# Patient Record
Sex: Female | Born: 1935
Health system: Southern US, Community
[De-identification: ages and names within clinical notes are randomized; demographics above are authoritative.]

## PROBLEM LIST (undated history)

## (undated) DIAGNOSIS — J45909 Unspecified asthma, uncomplicated: Secondary | ICD-10-CM

## (undated) DIAGNOSIS — R1032 Left lower quadrant pain: Secondary | ICD-10-CM

## (undated) DIAGNOSIS — I10 Essential (primary) hypertension: Secondary | ICD-10-CM

## (undated) DIAGNOSIS — D649 Anemia, unspecified: Secondary | ICD-10-CM

## (undated) DIAGNOSIS — R110 Nausea: Secondary | ICD-10-CM

## (undated) DIAGNOSIS — N200 Calculus of kidney: Secondary | ICD-10-CM

## (undated) DIAGNOSIS — K5730 Diverticulosis of large intestine without perforation or abscess without bleeding: Secondary | ICD-10-CM

## (undated) DIAGNOSIS — K5090 Crohn's disease, unspecified, without complications: Secondary | ICD-10-CM

## (undated) HISTORY — DX: Nausea: R11.0

## (undated) HISTORY — DX: Essential (primary) hypertension: I10

## (undated) HISTORY — DX: Crohn's disease, unspecified, without complications: K50.90

## (undated) HISTORY — DX: Anemia, unspecified: D64.9

## (undated) HISTORY — PX: CATARACT EXTRACTION: SUR2

## (undated) HISTORY — PX: VAGINAL HYSTERECTOMY: SHX2639

## (undated) HISTORY — DX: Diverticulosis of large intestine without perforation or abscess without bleeding: K57.30

## (undated) HISTORY — DX: Unspecified asthma, uncomplicated: J45.909

## (undated) HISTORY — DX: Calculus of kidney: N20.0

## (undated) HISTORY — DX: Left lower quadrant pain: R10.32

---

## 1998-02-11 ENCOUNTER — Ambulatory Visit (HOSPITAL_COMMUNITY): Admission: RE | Admit: 1998-02-11 | Discharge: 1998-02-11 | Payer: Self-pay | Admitting: Internal Medicine

## 1999-11-21 ENCOUNTER — Ambulatory Visit (HOSPITAL_COMMUNITY): Admission: RE | Admit: 1999-11-21 | Discharge: 1999-11-21 | Payer: Self-pay | Admitting: Family Medicine

## 1999-11-21 ENCOUNTER — Encounter: Payer: Self-pay | Admitting: Family Medicine

## 1999-12-01 ENCOUNTER — Encounter: Admission: RE | Admit: 1999-12-01 | Discharge: 1999-12-14 | Payer: Self-pay

## 2000-04-16 ENCOUNTER — Emergency Department (HOSPITAL_COMMUNITY): Admission: EM | Admit: 2000-04-16 | Discharge: 2000-04-16 | Payer: Self-pay | Admitting: Emergency Medicine

## 2001-07-21 ENCOUNTER — Emergency Department (HOSPITAL_COMMUNITY): Admission: EM | Admit: 2001-07-21 | Discharge: 2001-07-21 | Payer: Self-pay | Admitting: Emergency Medicine

## 2002-01-14 ENCOUNTER — Emergency Department (HOSPITAL_COMMUNITY): Admission: EM | Admit: 2002-01-14 | Discharge: 2002-01-15 | Payer: Self-pay | Admitting: Emergency Medicine

## 2003-06-04 ENCOUNTER — Emergency Department (HOSPITAL_COMMUNITY): Admission: EM | Admit: 2003-06-04 | Discharge: 2003-06-04 | Payer: Self-pay | Admitting: Emergency Medicine

## 2004-04-08 ENCOUNTER — Emergency Department (HOSPITAL_COMMUNITY): Admission: EM | Admit: 2004-04-08 | Discharge: 2004-04-08 | Payer: Self-pay | Admitting: Family Medicine

## 2004-05-05 ENCOUNTER — Emergency Department (HOSPITAL_COMMUNITY): Admission: EM | Admit: 2004-05-05 | Discharge: 2004-05-05 | Payer: Self-pay | Admitting: Emergency Medicine

## 2004-06-20 ENCOUNTER — Ambulatory Visit: Payer: Self-pay | Admitting: *Deleted

## 2004-07-18 ENCOUNTER — Ambulatory Visit: Payer: Self-pay | Admitting: Family Medicine

## 2005-01-13 ENCOUNTER — Emergency Department (HOSPITAL_COMMUNITY): Admission: EM | Admit: 2005-01-13 | Discharge: 2005-01-13 | Payer: Self-pay | Admitting: Orthopedic Surgery

## 2006-01-17 ENCOUNTER — Emergency Department (HOSPITAL_COMMUNITY): Admission: EM | Admit: 2006-01-17 | Discharge: 2006-01-17 | Payer: Self-pay | Admitting: Emergency Medicine

## 2006-02-05 ENCOUNTER — Encounter: Admission: RE | Admit: 2006-02-05 | Discharge: 2006-05-06 | Payer: Self-pay | Admitting: Orthopedic Surgery

## 2006-04-04 ENCOUNTER — Ambulatory Visit: Payer: Self-pay | Admitting: Psychology

## 2006-11-14 ENCOUNTER — Emergency Department (HOSPITAL_COMMUNITY): Admission: EM | Admit: 2006-11-14 | Discharge: 2006-11-14 | Payer: Self-pay | Admitting: Emergency Medicine

## 2007-12-04 ENCOUNTER — Ambulatory Visit: Payer: Self-pay | Admitting: Internal Medicine

## 2007-12-04 LAB — CONVERTED CEMR LAB
ALT: 14 units/L (ref 0–35)
Alkaline Phosphatase: 97 units/L (ref 39–117)
Basophils Absolute: 0.3 10*3/uL — ABNORMAL HIGH (ref 0.0–0.1)
Creatinine, Ser: 0.5 mg/dL (ref 0.4–1.2)
Eosinophils Absolute: 0.2 10*3/uL (ref 0.0–0.6)
Eosinophils Relative: 3.7 % (ref 0.0–5.0)
HCT: 27 % — ABNORMAL LOW (ref 36.0–46.0)
Lymphocytes Relative: 23.5 % (ref 12.0–46.0)
MCHC: 29.3 g/dL — ABNORMAL LOW (ref 30.0–36.0)
MCV: 61.8 fL — ABNORMAL LOW (ref 78.0–100.0)
Neutrophils Relative %: 63.7 % (ref 43.0–77.0)
Platelets: 404 10*3/uL — ABNORMAL HIGH (ref 150–400)
Potassium: 4.5 meq/L (ref 3.5–5.1)
RBC: 4.37 M/uL (ref 3.87–5.11)
RDW: 18.1 % — ABNORMAL HIGH (ref 11.5–14.6)
Saturation Ratios: 5.9 % — ABNORMAL LOW (ref 20.0–50.0)
WBC: 6.7 10*3/uL (ref 4.5–10.5)

## 2007-12-05 ENCOUNTER — Ambulatory Visit (HOSPITAL_COMMUNITY): Admission: RE | Admit: 2007-12-05 | Discharge: 2007-12-05 | Payer: Self-pay | Admitting: Internal Medicine

## 2007-12-08 ENCOUNTER — Encounter: Payer: Self-pay | Admitting: Internal Medicine

## 2007-12-08 ENCOUNTER — Ambulatory Visit (HOSPITAL_COMMUNITY): Admission: RE | Admit: 2007-12-08 | Discharge: 2007-12-08 | Payer: Self-pay | Admitting: Internal Medicine

## 2007-12-11 ENCOUNTER — Ambulatory Visit: Payer: Self-pay | Admitting: Internal Medicine

## 2007-12-26 ENCOUNTER — Ambulatory Visit: Payer: Self-pay | Admitting: Internal Medicine

## 2007-12-26 LAB — CONVERTED CEMR LAB
Basophils Absolute: 0.1 10*3/uL (ref 0.0–0.1)
Basophils Relative: 0.9 % (ref 0.0–1.0)
Eosinophils Absolute: 0.2 10*3/uL (ref 0.0–0.7)
Eosinophils Relative: 3.8 % (ref 0.0–5.0)
HCT: 37.5 % (ref 36.0–46.0)
Hemoglobin: 11.9 g/dL — ABNORMAL LOW (ref 12.0–15.0)
Lymphocytes Relative: 24.5 % (ref 12.0–46.0)
MCHC: 31.8 g/dL (ref 30.0–36.0)
MCV: 73 fL — ABNORMAL LOW (ref 78.0–100.0)
Neutro Abs: 3.8 10*3/uL (ref 1.4–7.7)
RBC: 5.13 M/uL — ABNORMAL HIGH (ref 3.87–5.11)
RDW: 30 % — ABNORMAL HIGH (ref 11.5–14.6)

## 2008-01-13 ENCOUNTER — Telehealth: Payer: Self-pay | Admitting: Internal Medicine

## 2008-01-15 ENCOUNTER — Telehealth: Payer: Self-pay | Admitting: Internal Medicine

## 2008-01-15 ENCOUNTER — Ambulatory Visit: Payer: Self-pay | Admitting: Internal Medicine

## 2008-01-23 ENCOUNTER — Ambulatory Visit: Payer: Self-pay | Admitting: Internal Medicine

## 2008-01-23 ENCOUNTER — Encounter: Payer: Self-pay | Admitting: Internal Medicine

## 2008-01-29 ENCOUNTER — Encounter: Payer: Self-pay | Admitting: Internal Medicine

## 2008-01-30 ENCOUNTER — Encounter: Payer: Self-pay | Admitting: Internal Medicine

## 2008-02-16 DIAGNOSIS — J209 Acute bronchitis, unspecified: Secondary | ICD-10-CM | POA: Insufficient documentation

## 2008-02-16 DIAGNOSIS — R110 Nausea: Secondary | ICD-10-CM | POA: Insufficient documentation

## 2008-02-16 DIAGNOSIS — K5730 Diverticulosis of large intestine without perforation or abscess without bleeding: Secondary | ICD-10-CM | POA: Insufficient documentation

## 2008-02-16 DIAGNOSIS — D649 Anemia, unspecified: Secondary | ICD-10-CM | POA: Insufficient documentation

## 2008-02-16 DIAGNOSIS — R1032 Left lower quadrant pain: Secondary | ICD-10-CM | POA: Insufficient documentation

## 2008-02-16 DIAGNOSIS — Z87442 Personal history of urinary calculi: Secondary | ICD-10-CM | POA: Insufficient documentation

## 2008-02-16 DIAGNOSIS — I10 Essential (primary) hypertension: Secondary | ICD-10-CM | POA: Insufficient documentation

## 2008-02-18 ENCOUNTER — Ambulatory Visit: Payer: Self-pay | Admitting: Internal Medicine

## 2008-02-18 LAB — CONVERTED CEMR LAB
Basophils Absolute: 0 10*3/uL (ref 0.0–0.1)
Basophils Relative: 0 % (ref 0.0–1.0)
Eosinophils Relative: 0.2 % (ref 0.0–5.0)
HCT: 43.4 % (ref 36.0–46.0)
Hemoglobin: 14.6 g/dL (ref 12.0–15.0)
MCHC: 33.7 g/dL (ref 30.0–36.0)
Monocytes Relative: 4.7 % (ref 3.0–12.0)
RBC: 5 M/uL (ref 3.87–5.11)

## 2008-04-08 ENCOUNTER — Telehealth: Payer: Self-pay | Admitting: Internal Medicine

## 2008-04-28 ENCOUNTER — Ambulatory Visit: Payer: Self-pay | Admitting: Internal Medicine

## 2008-04-28 LAB — CONVERTED CEMR LAB
Basophils Absolute: 0 10*3/uL (ref 0.0–0.1)
Basophils Relative: 0.1 % (ref 0.0–3.0)
MCHC: 35.7 g/dL (ref 30.0–36.0)
MCV: 96.1 fL (ref 78.0–100.0)
Monocytes Absolute: 0.4 10*3/uL (ref 0.1–1.0)
Neutrophils Relative %: 79.5 % — ABNORMAL HIGH (ref 43.0–77.0)
Saturation Ratios: 43.9 % (ref 20.0–50.0)

## 2008-05-06 ENCOUNTER — Telehealth: Payer: Self-pay | Admitting: Internal Medicine

## 2008-05-11 ENCOUNTER — Ambulatory Visit (HOSPITAL_COMMUNITY): Admission: RE | Admit: 2008-05-11 | Discharge: 2008-05-11 | Payer: Self-pay | Admitting: Internal Medicine

## 2008-06-10 ENCOUNTER — Ambulatory Visit: Payer: Self-pay | Admitting: Internal Medicine

## 2008-06-10 LAB — CONVERTED CEMR LAB
Basophils Absolute: 0.1 10*3/uL (ref 0.0–0.1)
Eosinophils Absolute: 0.2 10*3/uL (ref 0.0–0.7)
Eosinophils Relative: 2.9 % (ref 0.0–5.0)
HCT: 39.8 % (ref 36.0–46.0)
Hemoglobin: 13.9 g/dL (ref 12.0–15.0)
Lymphocytes Relative: 19.1 % (ref 12.0–46.0)
MCV: 93.4 fL (ref 78.0–100.0)
Neutro Abs: 5.7 10*3/uL (ref 1.4–7.7)
RDW: 11.6 % (ref 11.5–14.6)
WBC: 7.9 10*3/uL (ref 4.5–10.5)

## 2008-07-05 ENCOUNTER — Telehealth: Payer: Self-pay | Admitting: Internal Medicine

## 2008-08-23 ENCOUNTER — Ambulatory Visit: Payer: Self-pay | Admitting: Internal Medicine

## 2008-08-24 ENCOUNTER — Telehealth: Payer: Self-pay | Admitting: Internal Medicine

## 2008-10-14 ENCOUNTER — Telehealth: Payer: Self-pay | Admitting: Internal Medicine

## 2009-01-14 ENCOUNTER — Encounter: Admission: RE | Admit: 2009-01-14 | Discharge: 2009-01-14 | Payer: Self-pay | Admitting: Internal Medicine

## 2009-08-01 ENCOUNTER — Ambulatory Visit: Payer: Self-pay | Admitting: Internal Medicine

## 2009-08-01 ENCOUNTER — Telehealth: Payer: Self-pay | Admitting: Internal Medicine

## 2009-08-02 ENCOUNTER — Ambulatory Visit (HOSPITAL_COMMUNITY): Admission: RE | Admit: 2009-08-02 | Discharge: 2009-08-02 | Payer: Self-pay | Admitting: Internal Medicine

## 2009-08-02 LAB — CONVERTED CEMR LAB
Basophils Absolute: 0.1 10*3/uL (ref 0.0–0.1)
Basophils Relative: 0.9 % (ref 0.0–3.0)
Eosinophils Absolute: 0.2 10*3/uL (ref 0.0–0.7)
Hemoglobin: 7.7 g/dL — CL (ref 12.0–15.0)
Lymphs Abs: 1.6 10*3/uL (ref 0.7–4.0)
MCHC: 29.9 g/dL — ABNORMAL LOW (ref 30.0–36.0)
MCV: 62.5 fL — ABNORMAL LOW (ref 78.0–100.0)
Monocytes Relative: 10.3 % (ref 3.0–12.0)
Neutro Abs: 3.5 10*3/uL (ref 1.4–7.7)
Neutrophils Relative %: 60.2 % (ref 43.0–77.0)
RBC: 4.11 M/uL (ref 3.87–5.11)
RDW: 17.8 % — ABNORMAL HIGH (ref 11.5–14.6)
WBC: 6 10*3/uL (ref 4.5–10.5)

## 2009-08-09 ENCOUNTER — Ambulatory Visit: Payer: Self-pay | Admitting: Internal Medicine

## 2009-08-10 LAB — CONVERTED CEMR LAB
ALT: 18 units/L (ref 0–35)
CO2: 30 meq/L (ref 19–32)
Calcium: 9.3 mg/dL (ref 8.4–10.5)
Chloride: 101 meq/L (ref 96–112)
Creatinine, Ser: 0.5 mg/dL (ref 0.4–1.2)
Eosinophils Absolute: 0.2 10*3/uL (ref 0.0–0.7)
Eosinophils Relative: 3.2 % (ref 0.0–5.0)
GFR calc non Af Amer: 128.76 mL/min (ref >60)
Iron: 22 ug/dL — ABNORMAL LOW (ref 42–145)
MCV: 67 fL — ABNORMAL LOW (ref 78.0–100.0)
Neutrophils Relative %: 60.3 % (ref 43.0–77.0)
Saturation Ratios: 4.6 % — ABNORMAL LOW (ref 20.0–50.0)
Sodium: 141 meq/L (ref 135–145)
WBC: 5.2 10*3/uL (ref 4.5–10.5)

## 2009-08-23 ENCOUNTER — Ambulatory Visit: Payer: Self-pay | Admitting: Internal Medicine

## 2009-08-23 LAB — CONVERTED CEMR LAB
Basophils Relative: 0.5 % (ref 0.0–3.0)
Eosinophils Absolute: 0.2 10*3/uL (ref 0.0–0.7)
Eosinophils Relative: 2.9 % (ref 0.0–5.0)
HCT: 33.2 % — ABNORMAL LOW (ref 36.0–46.0)
MCHC: 31.1 g/dL (ref 30.0–36.0)
MCV: 68.9 fL — ABNORMAL LOW (ref 78.0–100.0)
Monocytes Relative: 8.7 % (ref 3.0–12.0)
Neutro Abs: 4.5 10*3/uL (ref 1.4–7.7)
Platelets: 327 10*3/uL (ref 150.0–400.0)
RBC: 4.83 M/uL (ref 3.87–5.11)
RDW: 25.3 % — ABNORMAL HIGH (ref 11.5–14.6)
WBC: 6.9 10*3/uL (ref 4.5–10.5)

## 2009-09-23 ENCOUNTER — Ambulatory Visit: Payer: Self-pay | Admitting: Internal Medicine

## 2009-09-27 ENCOUNTER — Telehealth: Payer: Self-pay | Admitting: Internal Medicine

## 2009-09-27 LAB — CONVERTED CEMR LAB
Basophils Relative: 0.9 % (ref 0.0–3.0)
Lymphs Abs: 1.4 10*3/uL (ref 0.7–4.0)
MCHC: 31.2 g/dL (ref 30.0–36.0)
MCV: 73.8 fL — ABNORMAL LOW (ref 78.0–100.0)
Monocytes Absolute: 0.3 10*3/uL (ref 0.1–1.0)
Monocytes Relative: 6.2 % (ref 3.0–12.0)
Platelets: 301 10*3/uL (ref 150.0–400.0)
RBC: 4.62 M/uL (ref 3.87–5.11)
WBC: 5.5 10*3/uL (ref 4.5–10.5)

## 2009-10-04 ENCOUNTER — Telehealth: Payer: Self-pay | Admitting: Internal Medicine

## 2009-10-06 ENCOUNTER — Ambulatory Visit: Payer: Self-pay | Admitting: Internal Medicine

## 2009-10-07 LAB — CONVERTED CEMR LAB
Basophils Relative: 0.5 % (ref 0.0–3.0)
Eosinophils Absolute: 0.2 10*3/uL (ref 0.0–0.7)
HCT: 35 % — ABNORMAL LOW (ref 36.0–46.0)
Lymphs Abs: 1.6 10*3/uL (ref 0.7–4.0)
MCV: 75.6 fL — ABNORMAL LOW (ref 78.0–100.0)
Monocytes Absolute: 0.5 10*3/uL (ref 0.1–1.0)
Monocytes Relative: 7.8 % (ref 3.0–12.0)
Neutro Abs: 4.5 10*3/uL (ref 1.4–7.7)
Platelets: 312 10*3/uL (ref 150.0–400.0)
RBC: 4.63 M/uL (ref 3.87–5.11)
RDW: 23.3 % — ABNORMAL HIGH (ref 11.5–14.6)

## 2009-10-11 ENCOUNTER — Encounter (HOSPITAL_COMMUNITY): Admission: RE | Admit: 2009-10-11 | Discharge: 2010-01-09 | Payer: Self-pay | Admitting: Cardiology

## 2009-10-26 ENCOUNTER — Encounter (INDEPENDENT_AMBULATORY_CARE_PROVIDER_SITE_OTHER): Payer: Self-pay | Admitting: *Deleted

## 2009-10-28 ENCOUNTER — Ambulatory Visit: Payer: Self-pay | Admitting: Internal Medicine

## 2009-10-31 LAB — CONVERTED CEMR LAB
Basophils Relative: 1.2 % (ref 0.0–3.0)
Eosinophils Absolute: 0.2 10*3/uL (ref 0.0–0.7)
Hemoglobin: 12.1 g/dL (ref 12.0–15.0)
Iron: 56 ug/dL (ref 42–145)
Lymphocytes Relative: 26.7 % (ref 12.0–46.0)
Lymphs Abs: 1.5 10*3/uL (ref 0.7–4.0)
MCHC: 32.8 g/dL (ref 30.0–36.0)
Monocytes Absolute: 0.5 10*3/uL (ref 0.1–1.0)
Neutro Abs: 3.4 10*3/uL (ref 1.4–7.7)
Neutrophils Relative %: 59.9 % (ref 43.0–77.0)
Platelets: 241 10*3/uL (ref 150.0–400.0)
Saturation Ratios: 18.5 % — ABNORMAL LOW (ref 20.0–50.0)
Transferrin: 216.7 mg/dL (ref 212.0–360.0)
WBC: 5.7 10*3/uL (ref 4.5–10.5)

## 2009-12-26 ENCOUNTER — Ambulatory Visit: Payer: Self-pay | Admitting: Internal Medicine

## 2009-12-27 LAB — CONVERTED CEMR LAB
Basophils Absolute: 0 10*3/uL (ref 0.0–0.1)
Eosinophils Relative: 3.2 % (ref 0.0–5.0)
HCT: 39.5 % (ref 36.0–46.0)
Hemoglobin: 13.5 g/dL (ref 12.0–15.0)
MCHC: 34.2 g/dL (ref 30.0–36.0)
Monocytes Absolute: 0.5 10*3/uL (ref 0.1–1.0)
Neutro Abs: 4.7 10*3/uL (ref 1.4–7.7)

## 2010-01-02 ENCOUNTER — Ambulatory Visit: Payer: Self-pay | Admitting: Internal Medicine

## 2010-01-02 DIAGNOSIS — K5000 Crohn's disease of small intestine without complications: Secondary | ICD-10-CM | POA: Insufficient documentation

## 2010-02-13 ENCOUNTER — Ambulatory Visit: Payer: Self-pay | Admitting: Internal Medicine

## 2010-02-13 LAB — CONVERTED CEMR LAB
Basophils Absolute: 0 10*3/uL (ref 0.0–0.1)
Basophils Relative: 0.7 % (ref 0.0–3.0)
Eosinophils Relative: 4.9 % (ref 0.0–5.0)
Lymphs Abs: 1.1 10*3/uL (ref 0.7–4.0)
Monocytes Absolute: 0.3 10*3/uL (ref 0.1–1.0)
Monocytes Relative: 5.9 % (ref 3.0–12.0)
Neutrophils Relative %: 67.5 % (ref 43.0–77.0)
Platelets: 320 10*3/uL (ref 150.0–400.0)
WBC: 5.5 10*3/uL (ref 4.5–10.5)

## 2010-05-16 ENCOUNTER — Ambulatory Visit: Payer: Self-pay | Admitting: Internal Medicine

## 2010-05-16 LAB — CONVERTED CEMR LAB
Basophils Relative: 0.7 % (ref 0.0–3.0)
Eosinophils Absolute: 0.2 10*3/uL (ref 0.0–0.7)
Hemoglobin: 11.3 g/dL — ABNORMAL LOW (ref 12.0–15.0)
MCHC: 33.5 g/dL (ref 30.0–36.0)
Monocytes Absolute: 0.6 10*3/uL (ref 0.1–1.0)
Monocytes Relative: 8.9 % (ref 3.0–12.0)
Neutro Abs: 3.9 10*3/uL (ref 1.4–7.7)
Neutrophils Relative %: 60.7 % (ref 43.0–77.0)
Platelets: 309 10*3/uL (ref 150.0–400.0)
RBC: 4.03 M/uL (ref 3.87–5.11)
Saturation Ratios: 10.9 % — ABNORMAL LOW (ref 20.0–50.0)
WBC: 6.5 10*3/uL (ref 4.5–10.5)

## 2010-05-19 ENCOUNTER — Encounter (HOSPITAL_COMMUNITY): Admission: RE | Admit: 2010-05-19 | Discharge: 2010-07-11 | Payer: Self-pay | Admitting: Internal Medicine

## 2010-06-26 ENCOUNTER — Telehealth: Payer: Self-pay | Admitting: Internal Medicine

## 2010-08-16 ENCOUNTER — Ambulatory Visit: Payer: Self-pay | Admitting: Internal Medicine

## 2010-08-17 LAB — CONVERTED CEMR LAB
Basophils Relative: 0.6 % (ref 0.0–3.0)
Iron: 68 ug/dL (ref 42–145)
MCHC: 34.1 g/dL (ref 30.0–36.0)
Monocytes Absolute: 0.6 10*3/uL (ref 0.1–1.0)
Neutrophils Relative %: 63.7 % (ref 43.0–77.0)
RBC: 4.62 M/uL (ref 3.87–5.11)
RDW: 16.9 % — ABNORMAL HIGH (ref 11.5–14.6)
Saturation Ratios: 17.5 % — ABNORMAL LOW (ref 20.0–50.0)
Transferrin: 277.5 mg/dL (ref 212.0–360.0)

## 2010-10-12 NOTE — Progress Notes (Signed)
Summary: Pt. Rescheduled Iron Infusion  Phone Note Call from Patient Call back at Home Phone [phone]   Caller: Patient Call For: Dr. Juanda Chance Reason for Call: Talk to Nurse Summary of Call: pt says she was supposed to get an iron infusion today at [facility], but she is sick this morning and thus would like to resch Initial call taken by: Vallarie Mare,  October 04, 2009 8:37 AM  Follow-up for Phone Call        Iron infusion is rescheduled for 10-11-09 at 9am. Per [facility] Short Stay, pt. needs to have her labs redone, she will get them done this week, the order is in [facility]. Pt. instructed to call back as needed.  Follow-up by: Laureen Ochs LPN,  October 04, 2009 9:26 AM

## 2010-10-12 NOTE — Progress Notes (Signed)
Summary: Triage  Phone Note Call from Patient Call back at Home Phone [phone]   Caller: Patient Call For: Dr. Juanda Chance Reason for Call: Talk to Nurse Summary of Call: having cateract surgery wants to know if she needs bloodwork Initial call taken by: Karna Christmas,  June 26, 2010 12:51 PM  Follow-up for Phone Call        Patient  needs to ask her surgeon if he wants lab work.  No new lab orders from [facility]. Follow-up by: Darcey Nora RN, CGRN,  June 26, 2010 1:40 PM

## 2010-10-12 NOTE — Assessment & Plan Note (Signed)
Summary: f/u--ch.   History of Present Illness Visit Type: Follow-up Visit Primary GI MD: [name] MD Primary Provider: n/a Requesting Provider: n/a Chief Complaint: Routine f/u, ? crohn's.  Pt states that she is better and denies any GI complaints  History of Present Illness:   This is a 76 year old white female with Crohn's disease of the small bowel which was seen on a small bowel capsule endoscopy in June 2009. Patient had multiple ulcerations in the ileum. She responded to sulfasalazine 1 g twice a day. Her anemia was corrected with iron. She then presented with rectal bleeding and a hemoglobin of 7.7 with an MCV of 62. She was transfused 2 units of packed red blood cells at that time and her hemoglobin then returned to normal. Her upper endoscopy and colonoscopies in March 2009 showed severe diverticulosis of the left colon but no evidence of IBD. Her IBD markers are negative. She is intolerant to 6-MP. After a slow decrease in patient's hemoglobin and iron saturation, she was given another iron infusion. On 12/26/09 patient's hemoglobin was at 13.5 and her hematocrit was 39.5. Her iron saturation was again normal at 21.8% up from her previous 18.5% in February 2011. He most recent iron level was 70 and her transferritin was 228.9. Patient comes today for a routine follow up. She currently denies any gastroentestinal complaints.   GI Review of Systems      Denies abdominal pain, acid reflux, belching, bloating, chest pain, dysphagia with liquids, dysphagia with solids, heartburn, loss of appetite, nausea, vomiting, vomiting blood, weight loss, and  weight gain.        Denies anal fissure, black tarry stools, change in bowel habit, constipation, diarrhea, diverticulosis, fecal incontinence, heme positive stool, hemorrhoids, irritable bowel syndrome, jaundice, light color stool, liver problems, rectal bleeding, and  rectal pain.    Current Medications (verified): 1)  Bentyl 10 Mg Caps  (Dicyclomine Hcl) .... Take 1 Capsule By Mouth Twice A Day 2)  Aspirin 81 Mg  Tbec (Aspirin) .... Take 1 Tablet By Mouth Once A Day 3)  Lisinopril 10 Mg  Tabs (Lisinopril) .... Take 1 Tablet By Mouth Once A Day 4)  Proventil Hfa 108 (90 Base) Mcg/act  Aers (Albuterol Sulfate) .... Two Puff Two Times A Day 5)  Iron 325 (65 Fe) Mg  Tabs (Ferrous Sulfate) .Marland Kitchen.. 1 Tablet  By Mouth Once Daily 6)  Tylenol Extra Strength 500 Mg  Tabs (Acetaminophen) .... One Once Daily As Needed 7)  Folic Acid 1 Mg Tabs (Folic Acid) .... Take 1 Tablet By Mouth Once A Day 8)  Vusion 0.25-15-81.35 % Oint (Miconazole-Zinc Oxide-Petrolat) .... Apply 1 Once Daily 9)  Sulfasalazine 500 Mg Tabs (Sulfasalazine) .... Take 2 Tablets By Mouth Two Times A Day . Must Keep Office Visit For Further Refills! 10)  Furosemide 20 Mg Tabs (Furosemide) .Marland Kitchen.. 1 Tablet By Mouth Once Daily  Allergies (verified): No Known Drug Allergies  Past History:  Past Medical History: Reviewed history from 02/16/2008 and no changes required. Current Problems:  NEPHROLITHIASIS, HX OF (ICD-V13.01) Hx of ASTHMATIC BRONCHITIS, ACUTE (ICD-466.0) HYPERTENSION (ICD-401.9) DIVERTICULOSIS OF COLON (ICD-562.10) NAUSEA (ICD-787.02) ABDOMINAL PAIN, LEFT LOWER QUADRANT (ICD-789.04) ANEMIA (ICD-285.9)  Past Surgical History: Reviewed history from 02/16/2008 and no changes required. Hysterectomy  Family History: Reviewed history from 02/16/2008 and no changes required. No FH of Colon Cancer Family History of Esophageal Cancer: Father Family History of Liver Cancer: Brother Family History of Leukemia: Brother  Social History: Reviewed history from 08/23/2008 and  no changes required. Patient is single Patient has 0 children Patient lives with Sister Patient has never smoked.  Alcohol Use - no Illicit Drug Use - no  Vital Signs:  Patient profile:   76 year old female Height:      66 inches Weight:      170 pounds BMI:     27.54 BSA:      1.87 Pulse rate:   88 / minute Pulse rhythm:   regular BP sitting:   126 / 74  (left arm) Cuff size:   regular  Vitals Entered By: Ok Anis CMA (January 02, 2010 2:25 PM)  Physical Exam  General:  Well developed, well nourished, no acute distress. Eyes:  PERRLA, no icterus. Mouth:  No deformity or lesions, dentition normal. Lungs:  Clear throughout to auscultation. Heart:  Regular rate and rhythm; no murmurs, rubs,  or bruits. Abdomen:  Soft, nontender and nondistended. No masses, hepatosplenomegaly or hernias noted. Normal bowel sounds. Rectal:  Hemoccult-positive stool Skin:  Intact without significant lesions or rashes. Psych:  Alert and cooperative. Normal mood and affect.   Impression & Recommendations:  Problem # 1:  REGIONAL ENTERITIS OF SMALL INTESTINE (ICD-555.0) Patient has continued low-grade GI blood loss due to Crohn's disease of the small bowel. This was shown on a small bowel capsule endoscopy. She will continue on sulfasalazine and she is currently 3 tablets a day and folic acid 1 mg daily. She is intolerant to iron supplements which causes cramps and abdominal pain. She received iron infusions in February of this year and her hemoglobin is now up to 13.5.  Problem # 2:  ANEMIA (ICD-285.9) Patient currently has a hemoglobin 13.5. Her iron saturation is at 21%. Stool is Hemoccult positive today. Patient cannot take oral iron so she will have to depend on iron infusions as well as eating foods rich in iron. I have given her a list of iron-containing foods to follow.  Patient Instructions: 1)  Please go to the basement to have your CBC drawn in 6 weeks per Dr Juanda Chance. This would be around 02/13/10 and your orders have already been entered in the system. 2)  Sulfasalazine 500 mg 3 tablets daily. 3)  Folic acid 1 mg daily. 4)  May require iron infusions. Her last one was in February 2011. 5)  Office visit 6 months 6)  The medication list was reviewed and reconciled.  All  changed / newly prescribed medications were explained.  A complete medication list was provided to the patient / caregiver.

## 2010-10-12 NOTE — Progress Notes (Signed)
Summary: Iron Infusion Scheduled  Phone Note Call from Patient Call back at Home Phone [phone]   Caller: Patient Call For: Juanda Chance Reason for Call: Talk to Nurse, Lab or Test Results Summary of Call: Patient wantrs lab resultd from last week Initial call taken by: Tawni Levy,  September 27, 2009 10:23 AM  Follow-up for Phone Call        MD orders from labs done 09-23-09 reviewed w/pt.  She requests her Iron infusion be scheduled at [facility]. She is scheduled for 10-04-09 at 8am. All instructions reviewed w/pt. by phone. Pt. instructed to call back as needed.     Follow-up by: Laureen Ochs LPN,  September 27, 2009 12:57 PM

## 2010-10-12 NOTE — Letter (Signed)
Summary: Office Visit Letter  [facility]  [address] [place], [place] [address]   Phone: [phone]  Fax: [phone]      October 26, 2009 MRN: [id number]   Cassandra Garcia [address] [place], [place]  [address]   Dear Ms. Heindl,   According to our records, it is time for you to schedule a follow-up office visit with [place].   At your convenience, please call [phone] (option #2)to schedule an office visit. If you have any questions, concerns, or feel that this letter is in error, we would appreciate your call.   Sincerely,  Hedwig Morton. Juanda Chance, M.D.  [facility] Gastroenterology Division [phone]

## 2010-11-10 ENCOUNTER — Telehealth: Payer: Self-pay | Admitting: Internal Medicine

## 2010-11-13 ENCOUNTER — Encounter (INDEPENDENT_AMBULATORY_CARE_PROVIDER_SITE_OTHER): Payer: Self-pay | Admitting: *Deleted

## 2010-11-13 ENCOUNTER — Other Ambulatory Visit: Payer: Self-pay | Admitting: Internal Medicine

## 2010-11-13 ENCOUNTER — Other Ambulatory Visit: Payer: BLUE CROSS/BLUE SHIELD

## 2010-11-13 DIAGNOSIS — D649 Anemia, unspecified: Secondary | ICD-10-CM

## 2010-11-13 LAB — CBC WITH DIFFERENTIAL/PLATELET
Eosinophils Absolute: 0.3 10*3/uL (ref 0.0–0.7)
HCT: 39.6 % (ref 36.0–46.0)
Lymphocytes Relative: 19.9 % (ref 12.0–46.0)
Lymphs Abs: 1.6 10*3/uL (ref 0.7–4.0)
MCHC: 34.3 g/dL (ref 30.0–36.0)
Monocytes Relative: 7.3 % (ref 3.0–12.0)
Platelets: 314 10*3/uL (ref 150.0–400.0)
RBC: 4.45 Mil/uL (ref 3.87–5.11)
RDW: 14.1 % (ref 11.5–14.6)

## 2010-11-13 LAB — IBC PANEL
Iron: 54 ug/dL (ref 42–145)
Saturation Ratios: 12.6 % — ABNORMAL LOW (ref 20.0–50.0)

## 2010-11-21 NOTE — Progress Notes (Signed)
Summary: ? re labs  Phone Note Call from Patient Call back at Home Phone [phone]   Caller: Patient Call For: Dr Juanda Chance Reason for Call: Talk to Nurse Summary of Call: Patient wants to know if she needs to have labs done because she gets labs every three months because of her Crohns. Initial call taken by: Tawni Levy,  November 10, 2010 8:37 AM  Follow-up for Phone Call        Spoke with patient. She is doing good and feels good. She has been getting labs(CBC, diff IBC panel) every 3 months and she wants to know if she needs to get them again. Her last labs on 08/16/2010 were satisfactory. Please, advise. Follow-up by: Jesse Fall RN,  November 10, 2010 9:12 AM  Additional Follow-up for Phone Call Additional follow up Details #1::        yes, last labs 08/16/2000 were normal, Iron sat 17%. Please repeat CBC and Iron studies next week. , then in 3 months. Thanx Additional Follow-up by: Hart Carwin MD,  November 10, 2010 1:15 PM    Additional Follow-up for Phone Call Additional follow up Details #2::    Labs in [facility] for 11/13/10 and 02/06/11. Message left for patient to call back. Jesse Fall, RN 11/10/10 3:10 PM Spoke witlh patient. She will come for labs this week. Flag to me to remind patient in 3 month for labs. Follow-up by: Jesse Fall RN,  November 13, 2010 8:35 AM

## 2010-12-13 LAB — CROSSMATCH

## 2011-01-23 NOTE — Assessment & Plan Note (Signed)
[facility]                         GASTROENTEROLOGY OFFICE NOTE   Cassandra, Garcia                      MRN:          [id number]  DATE:01/15/2008                            DOB:          [birth year]    Cassandra Garcia is a 76 year old white female a couple months ago with  severe anemia, microcytic indices and iron deficiency.  Upper GI  evaluation included CT scan, endoscopy and colonoscopy without any  findings specific site for the bleeding.  She has been on vigorous iron  supplements with good results.  Her blood count has risen from  hemoglobin of 7.9 to 11.9 on December 26, 2007.  She continues to be  microcytic with an MCV of 73.  Her only symptoms is occasional nausea  and left lower quadrant abdominal pain which may wake her up at night.  On colonoscopy the left colon was involved with severe sigmoid  diverticulosis with partial obstruction but no evidence of  diverticulitis.   PHYSICAL EXAMINATION:  VITAL SIGNS:  Blood pressure 124/80, pulse 72 and  weight 172 pounds.  GENERAL:  She was alert and oriented.  She had a good color and was  feeling well.  She has lost 10 pounds since the last exam.  LUNGS:  Clear to auscultation.  COR:  Normal S1, S2.  ABDOMEN:  Soft with minimal tenderness in the left lower quadrant  overlying the sigmoid colon.  Right lower and upper quadrants were  normal.  Liver edge at costal margin.  There was no ascites.  RECTAL:  Stool was trace Hemoccult positive.  It was dark due to the  iron.   IMPRESSION:  1. Ongoing gastrointestinal blood loss of unknown etiology after      negative CT scan, upper endoscopy and colonoscopy.  Rule out small      bowel source.  2. Improving iron deficiency anemia.  3. Severe diverticulosis of the left colon.   PLAN:  I have discussed the possibility of small bowel capsule endoscopy  with the patient.  For the moment she is feeling well and her hemoglobin  is rising so I would  wait another couple of months to decide whether we  will proceed with further evaluation.  She will continue the iron 3  times a day and have a repeat CBC on March 01, 2008.  We will refill her  Darvocet and she will continue on Bentyl 10 mg twice a day.     Hedwig Morton. Juanda Chance, MD  Electronically Signed    DMB/MedQ  DD: 01/15/2008  DT: 01/15/2008  Job #: [id number]   cc:   Norval Gable. [place], M.D.

## 2011-01-23 NOTE — Assessment & Plan Note (Signed)
[facility]                         GASTROENTEROLOGY OFFICE NOTE   Cassandra Garcia, Cassandra Garcia                      MRN:          [id number]  DATE:12/04/2007                            DOB:          1935/07/28    The patient is a very nice 76 year old white female patient and friend  of Dr. Leta Speller.  She is here today because of several-week history  of left lower quadrant abdominal pain which is localized anteriorly and  is intermittent.  It wakes her up at night.  Her bowel habits are  somewhat altered.  She has some diarrhea alternating with normal stools.  She has not seen any blood per rectum.  There has been no weight loss,  but she has been feeling rather weak and dizzy.  We have seen the  patient in the past in 1994 for a flexible sigmoidoscopy for evaluation  of rectal bleeding.  She at that time had a normal examination to  splenic flexure.  She gave a history of endometriosis at that time.   MEDICATIONS:  1. Furosemide 20 mg p.o. daily.  2. Aspirin 81 mg p.o. daily.  3. Lisinopril 10 mg p.o. daily.  4. Albuterol inhaler two puffs daily.   PAST MEDICAL HISTORY:  Significant for high blood pressure, asthmatic  bronchitis, kidney stones.   PAST SURGICAL HISTORY:  Hysterectomy.   FAMILY HISTORY:  Father had throat cancer.  Brother had liver cancer and  brother had leukemia.  No family history of colon cancer.   SOCIAL HISTORY:  Single.  No children.  She lives with her sister.   HABITS:  The patient does not smoke and does not drink alcohol.   REVIEW OF SYSTEMS:  Essentially negative other than symptoms pertaining  to her present illness.   PHYSICAL EXAMINATION:  VITAL SIGNS:  Blood pressure 122/70, pulse 80,  and weight 183 pounds.  GENERAL:  The patient was alert and oriented and in no distress.  HEENT:  Sclerae somewhat pale.  Oral cavity was normal.  NECK:  Supple with no adenopathy.  LUNGS:  Clear to auscultation.  HEART:   Normal S1 and normal S2.  ABDOMEN:  Mildly protuberant and very tender along the left colon with  no palpable mass and no rebound.  Bowel sounds were active.  Right lower  and upper quadrants were unremarkable.  Liver edge was at the costal  margin.  RECTAL:  Soft, stony, hemoccult positive stool.   IMPRESSION:  A 76 year old white female with three-week history of left-  sided abdominal pain.  She also had intermittent fever two weeks ago.  She had a normal flexible sigmoidoscopy 15 years ago.  Her symptoms are  suggestive of inflammatory process in the left lower quadrant most  likely diverticulitis.  She has a hemoccult positive stool and that  raises the question of malignancy and partial colon obstruction.  Inflammatory bowel disease is another possibility, although, she has not  had any consistent diarrhea.   PLAN:  1. CT scan of the abdomen and pelvis today with IV contrast.  2. CBC, sed rate, CMET today to assess her  for anemia.  3. Flagyl 250 mg p.o. t.i.d. x10 days.  4. Cipro 500 mg p.o. b.i.d. x10 days.  5. Low residue diet.   Depending on the results of the CT scan she will most likely need  colonoscopy depending whether this is inflammatory or malignant process.     Hedwig Morton. Juanda Chance, MD  Electronically Signed    DMB/MedQ  DD: 12/04/2007  DT: 12/04/2007  Job #: [id number]   cc:   Norval Gable. [place], M.D.

## 2011-02-01 ENCOUNTER — Telehealth: Payer: Self-pay | Admitting: *Deleted

## 2011-02-01 NOTE — Telephone Encounter (Signed)
Spoke with patient an reminded her to have labs done next Tues.

## 2011-02-01 NOTE — Telephone Encounter (Signed)
Message copied by Jesse Fall on Thu Feb 01, 2011  8:35 AM ------      Message from: Jesse Fall      Created: Wed Nov 15, 2010 11:28 AM       Call and remind patient to come for labs next week.

## 2011-02-06 ENCOUNTER — Other Ambulatory Visit (INDEPENDENT_AMBULATORY_CARE_PROVIDER_SITE_OTHER): Payer: BLUE CROSS/BLUE SHIELD

## 2011-02-06 ENCOUNTER — Other Ambulatory Visit: Payer: Self-pay | Admitting: Internal Medicine

## 2011-02-06 DIAGNOSIS — D649 Anemia, unspecified: Secondary | ICD-10-CM

## 2011-02-06 LAB — CBC WITH DIFFERENTIAL/PLATELET
Basophils Relative: 0.6 % (ref 0.0–3.0)
Eosinophils Absolute: 0.3 10*3/uL (ref 0.0–0.7)
Eosinophils Relative: 3.9 % (ref 0.0–5.0)
MCHC: 33 g/dL (ref 30.0–36.0)
Monocytes Absolute: 0.6 10*3/uL (ref 0.1–1.0)
Neutrophils Relative %: 61.9 % (ref 43.0–77.0)
RBC: 4.33 Mil/uL (ref 3.87–5.11)
RDW: 15.7 % — ABNORMAL HIGH (ref 11.5–14.6)
WBC: 6.5 10*3/uL (ref 4.5–10.5)

## 2011-02-07 ENCOUNTER — Telehealth: Payer: Self-pay | Admitting: *Deleted

## 2011-02-07 DIAGNOSIS — D509 Iron deficiency anemia, unspecified: Secondary | ICD-10-CM

## 2011-02-07 NOTE — Telephone Encounter (Signed)
Spoke with patient and she states she stopped her iron one week ago because it bothered her stomach. She will try another brand and see if it works better. Orders in for repeat labs in July. Note to remind patient.

## 2011-02-07 NOTE — Telephone Encounter (Signed)
Message copied by Daphine Deutscher on Wed Feb 07, 2011 11:28 AM ------      Message from: Hart Carwin      Created: Tue Feb 06, 2011  7:20 PM       Please call pt with normal CBC but very low Iron levels. Is she taking her Iron every day?, please increase to bid. Recheck CBC and Iron level 03/2011 in  2 months.

## 2011-02-12 ENCOUNTER — Other Ambulatory Visit: Payer: BLUE CROSS/BLUE SHIELD

## 2011-02-12 ENCOUNTER — Other Ambulatory Visit: Payer: Self-pay | Admitting: Internal Medicine

## 2011-02-12 DIAGNOSIS — D649 Anemia, unspecified: Secondary | ICD-10-CM

## 2011-02-27 ENCOUNTER — Telehealth: Payer: Self-pay | Admitting: Internal Medicine

## 2011-02-27 NOTE — Telephone Encounter (Signed)
Notified pt Dr Juanda Chance suggests she try Prenatal vites with iron, one a day. Pt stated understanding.

## 2011-02-27 NOTE — Telephone Encounter (Signed)
Pt reports she tried the Integra iron tablet and it caused abdominal cramping just like the OTC iron does. Can you recommend something else, Dr Juanda Chance?

## 2011-02-27 NOTE — Telephone Encounter (Signed)
Let her try Prenatal vitamins with Iron, 1 po qd

## 2011-03-07 ENCOUNTER — Other Ambulatory Visit: Payer: Self-pay | Admitting: Internal Medicine

## 2011-03-12 ENCOUNTER — Encounter: Payer: Self-pay | Admitting: *Deleted

## 2011-03-12 ENCOUNTER — Encounter: Payer: Self-pay | Admitting: Internal Medicine

## 2011-03-12 ENCOUNTER — Ambulatory Visit (INDEPENDENT_AMBULATORY_CARE_PROVIDER_SITE_OTHER): Payer: Medicare Other | Admitting: Internal Medicine

## 2011-03-12 ENCOUNTER — Telehealth: Payer: Self-pay | Admitting: Internal Medicine

## 2011-03-12 VITALS — BP 118/60 | HR 64 | Ht 66.0 in | Wt 175.8 lb

## 2011-03-12 DIAGNOSIS — K5090 Crohn's disease, unspecified, without complications: Secondary | ICD-10-CM

## 2011-03-12 DIAGNOSIS — D509 Iron deficiency anemia, unspecified: Secondary | ICD-10-CM

## 2011-03-12 DIAGNOSIS — R933 Abnormal findings on diagnostic imaging of other parts of digestive tract: Secondary | ICD-10-CM

## 2011-03-12 DIAGNOSIS — R1032 Left lower quadrant pain: Secondary | ICD-10-CM

## 2011-03-12 MED ORDER — HYDROCODONE-ACETAMINOPHEN 5-500 MG PO TABS: 1.0000 | ORAL_TABLET | ORAL | Status: AC | PRN

## 2011-03-12 MED ORDER — CIPROFLOXACIN HCL 250 MG PO TABS: 250.0000 mg | ORAL_TABLET | Freq: Two times a day (BID) | ORAL | Status: AC

## 2011-03-12 NOTE — Patient Instructions (Addendum)
Cipro 250mg  take one tablet twice a day has been sent to your pharmacy.  We have given you a rx for Vicodin today use as needed for abdominal pain. Tomi Bamberger, NP

## 2011-03-12 NOTE — Telephone Encounter (Signed)
Patient calling to report LLQ pain over the weekend. The pain was sharp, off and on and she rates it at an "8". She took Tylenol which helped a little bit and she was able to sleep a couple of hours. States she has never had pain like this. She did notice a little bright, red blood with her stool x 2 this weekend. Patient wants to see Dr. Juanda Chance. Scheduled patient today at 3:00 PM.

## 2011-03-12 NOTE — Progress Notes (Signed)
Cassandra Garcia 06-06-1935 MRN [id number]        History of Present Illness:  This is a 76 year old white female with chronic GI blood loss attributed to inflammatory bowel disease. She had a positive small bowel capsule endoscopy in 2009 showing a ulcerations of the small bowel. Her IBD markers are negative. She has been on iron supplements and iron infusion. Last hemoglobin was normal but low iron saturation of 12%. She's a work in today due to to severe left lower quadrant abdominal pain which bothers her this weekend. He denies any pain today. There has been small amount of bright red blood per rectum. Upper endoscopy and colonoscopy in 2009 were negative   Past Medical History  Diagnosis Date  . Anemia   . Nephrolithiasis   . Acute asthmatic bronchitis   . Hypertension   . Diverticulosis of colon   . Nausea   . Abdominal pain, left lower quadrant   . Crohn's disease    Past Surgical History  Procedure Date  . Vaginal hysterectomy     reports that she has never smoked. She has never used smokeless tobacco. She reports that she does not drink alcohol or use illicit drugs. family history includes Esophageal cancer in her father; Leukemia in her brother; and Liver cancer in her brother. No Known Allergies      Review of Systems: Denies dysphagia, odynophagia, shortness of breath or weight loss. Denies diarrhea or constipation  The remainder of the 10  point ROS is negative except as outlined in H&P   Physical Exam: General appearance  Well developed in no distress Eyes- non icteric HEENT nontraumatic, normocephalic Mouth no lesions, tongue papillated, no cheilosis Neck supple without adenopathy, thyroid not enlarged, no carotid bruits, no JVD Lungs Clear to auscultation bilaterally Cor normal S1 normal S2, regular rhythm , no murmur,  quiet precordium Abdomen mild tenderness left lower quadrant Rectal: Small amount of solid Hemoccult negative stool Extremities no  pedal edema Skin no lesions Neurological alert and oriented x3 Psychological normal mood and  affect  Assessment and Plan:   Problem #1 acute abdominal pain and now resolved. Residual minimal tenderness left lower quadrant. She has known diverticulosis. We will put her empirically on Cipro 250 by mouth twice a day for one week, a liquid diet.  Problem #2 inflammatory bowel disease. Chronic iron deficiency. Hemoccult negative on today's exam. She is due for repeat CBC at a later this month   03/12/2011 Cassandra Garcia

## 2011-03-16 ENCOUNTER — Telehealth: Payer: Self-pay | Admitting: Internal Medicine

## 2011-03-16 MED ORDER — METRONIDAZOLE 250 MG PO TABS: 250.0000 mg | ORAL_TABLET | Freq: Three times a day (TID) | ORAL | Status: AC

## 2011-03-16 NOTE — Telephone Encounter (Signed)
Per Dr. Juanda Chance stop Cipro. Start Flagyl 250 mg TID #21. Imodium prn diarrhea. Clear liquids, bland diet. Patient aware. Rx to pharmacy

## 2011-03-16 NOTE — Telephone Encounter (Signed)
Patient states she had had 10 watery, diarrhea stools today with abdominal cramping. States she woke up at 8:00 AM today with diarrhea.  She has taken anti diarrheal pills OTC x 2 without much relief. Denies any bleeding. She is taking Cipro 250 mg BID since 03/12/11. Can she have something called in for diarrhea.Scientific laboratory technician Road)

## 2011-03-19 ENCOUNTER — Telehealth: Payer: Self-pay | Admitting: Internal Medicine

## 2011-03-19 DIAGNOSIS — R197 Diarrhea, unspecified: Secondary | ICD-10-CM

## 2011-03-19 DIAGNOSIS — D649 Anemia, unspecified: Secondary | ICD-10-CM

## 2011-03-19 NOTE — Telephone Encounter (Signed)
Spoke with patient and she states her pain and diarrhea are better but she has not strength. States "I have never felt so washed out." Patient is due for lab work at the end of July but she wonders if she should get it done now. Please, advise.

## 2011-03-19 NOTE — Telephone Encounter (Signed)
OK to draw CBC, Iron studies and electrolytes now.

## 2011-03-20 ENCOUNTER — Telehealth: Payer: Self-pay | Admitting: *Deleted

## 2011-03-20 ENCOUNTER — Other Ambulatory Visit (INDEPENDENT_AMBULATORY_CARE_PROVIDER_SITE_OTHER): Payer: Medicare Other

## 2011-03-20 DIAGNOSIS — D649 Anemia, unspecified: Secondary | ICD-10-CM

## 2011-03-20 DIAGNOSIS — R197 Diarrhea, unspecified: Secondary | ICD-10-CM

## 2011-03-20 LAB — COMPREHENSIVE METABOLIC PANEL
AST: 25 U/L (ref 0–37)
Albumin: 4.1 g/dL (ref 3.5–5.2)
Alkaline Phosphatase: 74 U/L (ref 39–117)
BUN: 15 mg/dL (ref 6–23)
Creatinine, Ser: 0.7 mg/dL (ref 0.4–1.2)
Potassium: 4 mEq/L (ref 3.5–5.1)

## 2011-03-20 LAB — CBC WITH DIFFERENTIAL/PLATELET
Basophils Absolute: 0 10*3/uL (ref 0.0–0.1)
HCT: 35.5 % — ABNORMAL LOW (ref 36.0–46.0)
Hemoglobin: 11.6 g/dL — ABNORMAL LOW (ref 12.0–15.0)
Lymphs Abs: 1.3 10*3/uL (ref 0.7–4.0)
MCHC: 32.7 g/dL (ref 30.0–36.0)
Monocytes Relative: 11 % (ref 3.0–12.0)
Neutro Abs: 3.8 10*3/uL (ref 1.4–7.7)
RDW: 16.6 % — ABNORMAL HIGH (ref 11.5–14.6)

## 2011-03-20 LAB — IBC PANEL
Iron: 35 ug/dL — ABNORMAL LOW (ref 42–145)
Transferrin: 260.7 mg/dL (ref 212.0–360.0)

## 2011-03-20 MED ORDER — FERUMOXYTOL INJECTION 510 MG/17 ML: INTRAVENOUS | Status: DC

## 2011-03-20 NOTE — Telephone Encounter (Signed)
Patient notified of Dr. Brodie's recommendations. 

## 2011-03-20 NOTE — Telephone Encounter (Signed)
Message copied by Daphine Deutscher on Tue Mar 20, 2011  4:34 PM ------      Message from: Hart Carwin      Created: Tue Mar 20, 2011  1:14 PM       Please call pt with drop in H/H from the last time, also Iron very low. Please set up for Iron infusion. You may try the [facility] 510mg  IV x 2 doses week apart.Recheck CBC 4 weeks

## 2011-03-20 NOTE — Telephone Encounter (Signed)
Spoke with patient and gave her the results. She can go for IV iron any day but prefers after 11:00 AM. Scheduled with [facility] short stay on 03/23/11 at 2:30 PM. Orders faxed to short stay. Labs in [facility] for repeat CBC on 04/19/11. Note to remind patient.

## 2011-03-23 ENCOUNTER — Encounter (HOSPITAL_COMMUNITY): Payer: Medicare Other | Attending: Internal Medicine

## 2011-03-23 DIAGNOSIS — D509 Iron deficiency anemia, unspecified: Secondary | ICD-10-CM | POA: Insufficient documentation

## 2011-03-30 ENCOUNTER — Encounter (HOSPITAL_COMMUNITY): Payer: Medicare Other

## 2011-04-02 ENCOUNTER — Telehealth: Payer: Self-pay | Admitting: *Deleted

## 2011-04-02 NOTE — Telephone Encounter (Signed)
Patient notified of lab appointment. 

## 2011-04-02 NOTE — Telephone Encounter (Signed)
Message copied by Daphine Deutscher on Mon Apr 02, 2011  8:39 AM ------      Message from: Daphine Deutscher      Created: Wed Feb 07, 2011 11:34 AM       Patient due for CBC Iron levels on 04/06/11 (DB) call and remind patient

## 2011-04-06 ENCOUNTER — Other Ambulatory Visit (INDEPENDENT_AMBULATORY_CARE_PROVIDER_SITE_OTHER): Payer: Medicare Other

## 2011-04-06 DIAGNOSIS — D509 Iron deficiency anemia, unspecified: Secondary | ICD-10-CM

## 2011-04-06 LAB — CBC WITH DIFFERENTIAL/PLATELET
Basophils Relative: 0.5 % (ref 0.0–3.0)
Eosinophils Absolute: 0.3 10*3/uL (ref 0.0–0.7)
HCT: 38.8 % (ref 36.0–46.0)
Hemoglobin: 12.5 g/dL (ref 12.0–15.0)
Lymphs Abs: 1.2 10*3/uL (ref 0.7–4.0)
MCHC: 32.3 g/dL (ref 30.0–36.0)
MCV: 85.1 fl (ref 78.0–100.0)
Monocytes Absolute: 0.5 10*3/uL (ref 0.1–1.0)
Neutro Abs: 3.7 10*3/uL (ref 1.4–7.7)
RBC: 4.56 Mil/uL (ref 3.87–5.11)

## 2011-04-09 ENCOUNTER — Telehealth: Payer: Self-pay

## 2011-04-09 ENCOUNTER — Telehealth: Payer: Self-pay | Admitting: Internal Medicine

## 2011-04-09 DIAGNOSIS — D649 Anemia, unspecified: Secondary | ICD-10-CM

## 2011-04-09 NOTE — Telephone Encounter (Signed)
Pt aware.

## 2011-04-09 NOTE — Telephone Encounter (Signed)
Left message for pt to call back  °

## 2011-04-09 NOTE — Telephone Encounter (Signed)
Message copied by Michele Mcalpine on Mon Apr 09, 2011  2:54 PM ------      Message from: [place], [place] M      Created: Sun Apr 08, 2011 10:18 AM       Please call pt with her CBC being back to normal, Hgb 12.5, I am not sure why she is feeling so weak, she may check with her PCP. Repeat CBC in 2 months

## 2011-04-09 NOTE — Telephone Encounter (Signed)
Pt requesting her CBC and Iron levels. Results given to pt.

## 2011-04-16 ENCOUNTER — Encounter: Payer: Self-pay | Admitting: *Deleted

## 2011-04-16 NOTE — Telephone Encounter (Signed)
error 

## 2011-04-16 NOTE — Telephone Encounter (Signed)
Message copied by Daphine Deutscher on Mon Apr 16, 2011 10:38 AM ------      Message from: Daphine Deutscher      Created: Tue Mar 20, 2011  5:00 PM       Due for repeat CBC on 04/19/11 call and remind(DB)

## 2011-04-23 ENCOUNTER — Other Ambulatory Visit: Payer: Self-pay | Admitting: Internal Medicine

## 2011-04-30 ENCOUNTER — Other Ambulatory Visit: Payer: Self-pay | Admitting: Internal Medicine

## 2011-05-15 ENCOUNTER — Telehealth: Payer: Self-pay | Admitting: *Deleted

## 2011-05-15 NOTE — Telephone Encounter (Signed)
Left a message for patient to have repeat labs this week

## 2011-05-15 NOTE — Telephone Encounter (Signed)
Message copied by Daphine Deutscher on Tue May 15, 2011  9:33 AM ------      Message from: HUNT, LINDA R. R      Created: Mon Apr 09, 2011  2:56 PM      Regarding: lab       Needs repeat CBC

## 2011-05-16 ENCOUNTER — Other Ambulatory Visit (INDEPENDENT_AMBULATORY_CARE_PROVIDER_SITE_OTHER): Payer: Medicare Other

## 2011-05-16 DIAGNOSIS — D649 Anemia, unspecified: Secondary | ICD-10-CM

## 2011-05-16 LAB — CBC WITH DIFFERENTIAL/PLATELET
Basophils Absolute: 0.1 10*3/uL (ref 0.0–0.1)
Basophils Relative: 0.9 % (ref 0.0–3.0)
Eosinophils Absolute: 0.3 10*3/uL (ref 0.0–0.7)
HCT: 41.2 % (ref 36.0–46.0)
Hemoglobin: 13.7 g/dL (ref 12.0–15.0)
Lymphs Abs: 1.4 10*3/uL (ref 0.7–4.0)
MCHC: 33.3 g/dL (ref 30.0–36.0)
Neutro Abs: 5 10*3/uL (ref 1.4–7.7)
RDW: 20.8 % — ABNORMAL HIGH (ref 11.5–14.6)

## 2011-05-17 ENCOUNTER — Telehealth: Payer: Self-pay | Admitting: *Deleted

## 2011-05-17 DIAGNOSIS — D649 Anemia, unspecified: Secondary | ICD-10-CM

## 2011-05-17 NOTE — Telephone Encounter (Signed)
Left a message for patient with results. Labs in [facility]. Note to remind patient when labs are due.

## 2011-05-17 NOTE — Telephone Encounter (Signed)
Message copied by Daphine Deutscher on Thu May 17, 2011  2:13 PM ------      Message from: Hart Carwin      Created: Thu May 17, 2011 12:32 PM       Please call pt with improved CBC, repeat in 3 months.

## 2011-06-04 LAB — CROSSMATCH
ABO/RH(D): B POS
Antibody Screen: NEGATIVE

## 2011-06-04 LAB — ABO/RH: ABO/RH(D): B POS

## 2011-07-19 ENCOUNTER — Other Ambulatory Visit: Payer: Self-pay | Admitting: Internal Medicine

## 2011-07-22 ENCOUNTER — Other Ambulatory Visit: Payer: Self-pay | Admitting: Internal Medicine

## 2011-07-26 ENCOUNTER — Other Ambulatory Visit: Payer: Self-pay | Admitting: Nurse Practitioner

## 2011-07-26 ENCOUNTER — Ambulatory Visit
Admission: RE | Admit: 2011-07-26 | Discharge: 2011-07-26 | Disposition: A | Discharge status: Home / Self Care | Payer: Medicare Other | Source: Ambulatory Visit | Attending: Nurse Practitioner | Admitting: Nurse Practitioner

## 2011-07-26 DIAGNOSIS — R059 Cough, unspecified: Secondary | ICD-10-CM

## 2011-07-26 DIAGNOSIS — R05 Cough: Secondary | ICD-10-CM

## 2011-08-20 ENCOUNTER — Other Ambulatory Visit (INDEPENDENT_AMBULATORY_CARE_PROVIDER_SITE_OTHER): Payer: Medicare Other

## 2011-08-20 DIAGNOSIS — D649 Anemia, unspecified: Secondary | ICD-10-CM

## 2011-08-20 LAB — CBC WITH DIFFERENTIAL/PLATELET
Eosinophils Relative: 4.1 % (ref 0.0–5.0)
MCV: 87.3 fl (ref 78.0–100.0)
Monocytes Absolute: 0.3 10*3/uL (ref 0.1–1.0)
Neutrophils Relative %: 64.1 % (ref 43.0–77.0)
Platelets: 295 10*3/uL (ref 150.0–400.0)
WBC: 6.3 10*3/uL (ref 4.5–10.5)

## 2011-08-21 ENCOUNTER — Telehealth: Payer: Self-pay | Admitting: *Deleted

## 2011-08-21 ENCOUNTER — Other Ambulatory Visit: Payer: Self-pay | Admitting: Internal Medicine

## 2011-08-21 DIAGNOSIS — D649 Anemia, unspecified: Secondary | ICD-10-CM

## 2011-08-21 MED ORDER — INTEGRA PLUS PO CAPS: ORAL_CAPSULE | ORAL | Status: DC

## 2011-08-21 NOTE — Telephone Encounter (Signed)
Do we have any samples of Iron? If we do, she can pick it up and take 1 every other day.

## 2011-08-21 NOTE — Telephone Encounter (Signed)
Left a message for patient to call me. 

## 2011-08-21 NOTE — Telephone Encounter (Signed)
Message copied by Daphine Deutscher on Tue Aug 21, 2011  8:36 AM ------      Message from: Hart Carwin      Created: Mon Aug 20, 2011  8:53 PM       Please call pt with Hgb slightly lower than 3 months ago. Down to 11.8, please continue to take Iron and recheck in 8 weeks.

## 2011-08-21 NOTE — Telephone Encounter (Signed)
Patient with patient. Labs in EPIC She states she cannot take iron because it gives her diarrhea. States she has tried multiple types of iron and each one does the same thing to her. She states she cannot afford Integra so she did not try it. Please, advise what she should do.

## 2011-08-21 NOTE — Telephone Encounter (Signed)
Spoke with patient and samples of Integra up front for patient to pick up. She states she will pick up the samples tomorrow.

## 2011-09-20 DIAGNOSIS — R5383 Other fatigue: Secondary | ICD-10-CM | POA: Diagnosis not present

## 2011-09-20 DIAGNOSIS — R5381 Other malaise: Secondary | ICD-10-CM | POA: Diagnosis not present

## 2011-09-20 DIAGNOSIS — E7800 Pure hypercholesterolemia, unspecified: Secondary | ICD-10-CM | POA: Diagnosis not present

## 2011-09-20 DIAGNOSIS — I10 Essential (primary) hypertension: Secondary | ICD-10-CM | POA: Diagnosis not present

## 2011-10-10 ENCOUNTER — Telehealth: Payer: Self-pay | Admitting: *Deleted

## 2011-10-10 NOTE — Telephone Encounter (Signed)
Message copied by Daphine Deutscher on Wed Oct 10, 2011 10:12 AM ------      Message from: Daphine Deutscher      Created: Tue Aug 21, 2011  8:43 AM       Call and remind patient she is due for CBC for DB on 10/14/10

## 2011-10-10 NOTE — Telephone Encounter (Signed)
Spoke with patient and reminded her CBC is due next week.

## 2011-10-15 ENCOUNTER — Other Ambulatory Visit: Payer: Self-pay | Admitting: Internal Medicine

## 2011-10-15 ENCOUNTER — Other Ambulatory Visit (INDEPENDENT_AMBULATORY_CARE_PROVIDER_SITE_OTHER): Payer: Medicare Other

## 2011-10-15 DIAGNOSIS — D649 Anemia, unspecified: Secondary | ICD-10-CM

## 2011-10-15 LAB — CBC WITH DIFFERENTIAL/PLATELET
Basophils Relative: 0.6 % (ref 0.0–3.0)
Eosinophils Relative: 7 % — ABNORMAL HIGH (ref 0.0–5.0)
Lymphocytes Relative: 23.1 % (ref 12.0–46.0)
MCV: 81.6 fl (ref 78.0–100.0)
Monocytes Absolute: 0.6 10*3/uL (ref 0.1–1.0)
Neutrophils Relative %: 60.7 % (ref 43.0–77.0)
RBC: 4.22 Mil/uL (ref 3.87–5.11)
WBC: 6.9 10*3/uL (ref 4.5–10.5)

## 2011-10-16 ENCOUNTER — Other Ambulatory Visit: Payer: Self-pay | Admitting: *Deleted

## 2011-10-16 ENCOUNTER — Telehealth: Payer: Self-pay | Admitting: *Deleted

## 2011-10-16 NOTE — Telephone Encounter (Signed)
Please give a test dose with the Imfed. 25 mg IV test dose followed by a continuous infusion of 1000mg  over 3-4 hours

## 2011-10-16 NOTE — Telephone Encounter (Signed)
Message copied by Daphine Deutscher on Tue Oct 16, 2011  9:26 AM ------      Message from: Hart Carwin      Created: Mon Oct 15, 2011  5:16 PM       Please notify pt of a very low Hgb 10.9, please set her up for an Iron infusion of Imfed, no need for test dose, can You find out when she had a last infusion?, please give 1000 mg IV continuous infusion over 3-4 hours.

## 2011-10-16 NOTE — Telephone Encounter (Signed)
Patient received Feraheme 510 mg IV x 2 doses. One dose on 03/23/11 and second on 03/29/12. Should she get a test dose with the Infed infusion? Please advise.

## 2011-10-16 NOTE — Telephone Encounter (Signed)
Spoke with patient and gave her the appointment date and time. 

## 2011-10-16 NOTE — Telephone Encounter (Signed)
Spoke with [facility] short stay(Dee) and scheduled patient for IV infed on 10/22/11 at 8:30 AM. Left a message for patient to call me back.

## 2011-10-18 ENCOUNTER — Other Ambulatory Visit (HOSPITAL_COMMUNITY): Payer: Self-pay | Admitting: *Deleted

## 2011-10-22 ENCOUNTER — Telehealth: Payer: Self-pay | Admitting: Internal Medicine

## 2011-10-22 ENCOUNTER — Encounter (HOSPITAL_COMMUNITY): Admission: RE | Admit: 2011-10-22 | Payer: Medicare Other | Source: Ambulatory Visit

## 2011-10-22 NOTE — Telephone Encounter (Signed)
Spoke with patient and she cancelled her IV iron infusion and rescheduled it for 10/29/11 due to having diarrhea. States she was around someone who had diarrhea last week and now she has it.  She is interested in seeing if [facility] short [facility] can do it. [facility] could not do it. Patient notified.

## 2011-10-29 ENCOUNTER — Encounter (HOSPITAL_COMMUNITY): Payer: Self-pay

## 2011-10-29 ENCOUNTER — Encounter (HOSPITAL_COMMUNITY)
Admission: RE | Admit: 2011-10-29 | Discharge: 2011-10-29 | Disposition: A | Discharge status: Home / Self Care | Payer: Medicare Other | Source: Ambulatory Visit | Attending: Internal Medicine | Admitting: Internal Medicine

## 2011-10-29 DIAGNOSIS — D509 Iron deficiency anemia, unspecified: Secondary | ICD-10-CM | POA: Diagnosis not present

## 2011-10-29 MED ORDER — SODIUM CHLORIDE 0.9 % IV SOLN
1250.0000 mg | Freq: Once | INTRAVENOUS | Status: DC
  Filled 2011-10-29: qty 25

## 2011-10-29 MED ORDER — SODIUM CHLORIDE 0.9 % IV SOLN
1000.0000 mg | Freq: Once | INTRAVENOUS | Status: AC
  Administered 2011-10-29: 1000 mg via INTRAVENOUS
  Filled 2011-10-29: qty 20

## 2011-10-29 MED ORDER — SODIUM CHLORIDE 0.9 % IV SOLN
25.0000 mg | Freq: Once | INTRAVENOUS | Status: AC
  Administered 2011-10-29: 25 mg via INTRAVENOUS
  Filled 2011-10-29: qty 0.5

## 2011-10-29 MED ORDER — SODIUM CHLORIDE 0.9 % IV SOLN
INTRAVENOUS | Status: AC
  Administered 2011-10-29: 09:00:00 via INTRAVENOUS

## 2011-10-29 NOTE — Discharge Instructions (Signed)

## 2011-10-31 ENCOUNTER — Telehealth: Payer: Self-pay | Admitting: Internal Medicine

## 2011-10-31 MED ORDER — METHYLPREDNISOLONE 4 MG PO KIT: PACK | ORAL | Status: AC

## 2011-10-31 NOTE — Telephone Encounter (Signed)
Likely an allergic reaction to Iron dextran. Will not be able to take it next time. Please give her Medrol dose pack to take for 1 week. It will help her Crohn's too.

## 2011-10-31 NOTE — Telephone Encounter (Signed)
Patient calling to report she received IV iron on Monday. At about 11:00 PM, that night she had a temperature of 101, nausea, vomited x 1, diarrhea and stomach ache. She is feeling better today. She continues to force fluids and eating BRAT diet. She is not sure if it was a reaction to iron or a virus so she wanted Dr. Juanda Chance to know.

## 2011-10-31 NOTE — Telephone Encounter (Signed)
Rx sent to pharmacy. Left a message for patient to call me. 

## 2011-11-01 NOTE — Telephone Encounter (Signed)
Spoke with patient and gave her Dr. Brodie's recommendation. Rx sent to pharmacy. 

## 2011-11-02 ENCOUNTER — Telehealth: Payer: Self-pay | Admitting: Internal Medicine

## 2011-11-02 NOTE — Telephone Encounter (Signed)
Spoke with patient and gave her recommendations as per Dr. Brodie. 

## 2011-11-02 NOTE — Telephone Encounter (Signed)
Patient states she took pills from Medrol dose pack and her stomach hurt all night long. Sharp pain in stomach. She took Tylenol with no relief.She states she remembered she took Prednisone years ago and had nausea and pain. Wants to know if she should continue the dose pack and if so what she can try for the stomach pain.

## 2011-11-02 NOTE — Telephone Encounter (Signed)
She ought to try to finish it up, take with food, take Maalox of Gaviscon prn stomach pain. She may spread the dosepack over 10 days by taking 1 less pill every day,it will take longer to finish up.

## 2011-12-16 ENCOUNTER — Other Ambulatory Visit: Payer: Self-pay | Admitting: Internal Medicine

## 2011-12-16 DIAGNOSIS — K5090 Crohn's disease, unspecified, without complications: Secondary | ICD-10-CM

## 2011-12-17 ENCOUNTER — Telehealth: Payer: Self-pay | Admitting: Internal Medicine

## 2011-12-17 NOTE — Telephone Encounter (Signed)
I will. DB 

## 2011-12-17 NOTE — Telephone Encounter (Signed)
Patient wants to know if Dr. Juanda Chance will sign her "simple will" form stating her mind is okay. Please, advise.

## 2011-12-18 NOTE — Telephone Encounter (Signed)
Patient notified and she will bring form into the office

## 2011-12-18 NOTE — Telephone Encounter (Signed)
From: Richardson Chiquito, CMA    Sent: 12/18/2011      To: Richardson Chiquito, CMA Subject: FW: Azulfidine                                 Left message for patient to call back. ----- Message -----    From: Hart Carwin, MD    Sent: 12/17/2011  12:45 PM      To: Richardson Chiquito, CMA Subject: Azulfidine                                     [facility] refill Azulfidine 500mg , 2 po bid, #120, also please recheck CBC.,Iron studies, last check was in feb 2013 and was dropping. ----- Message -----    From: Richardson Chiquito, CMA    Sent: 12/17/2011   9:23 AM      To: Hart Carwin, MD      DB- This patient is requesting refills on Azulfadine. However, she should have been out since February 11 if she was taking it 2 tabs 2 times daily as rx'ed (unless you changed it, which I cannot see). Do you want me to go ahead and refill rx or do you want her to have labs or ov first?

## 2011-12-21 ENCOUNTER — Other Ambulatory Visit (INDEPENDENT_AMBULATORY_CARE_PROVIDER_SITE_OTHER): Payer: Medicare Other

## 2011-12-21 ENCOUNTER — Other Ambulatory Visit: Payer: Self-pay | Admitting: *Deleted

## 2011-12-21 DIAGNOSIS — D649 Anemia, unspecified: Secondary | ICD-10-CM

## 2011-12-21 DIAGNOSIS — K5090 Crohn's disease, unspecified, without complications: Secondary | ICD-10-CM | POA: Diagnosis not present

## 2011-12-21 LAB — CBC WITH DIFFERENTIAL/PLATELET
Basophils Relative: 0.7 % (ref 0.0–3.0)
Eosinophils Absolute: 0.4 10*3/uL (ref 0.0–0.7)
HCT: 39.1 % (ref 36.0–46.0)
Hemoglobin: 12.8 g/dL (ref 12.0–15.0)
Lymphs Abs: 1.6 10*3/uL (ref 0.7–4.0)
MCHC: 32.7 g/dL (ref 30.0–36.0)
MCV: 88 fl (ref 78.0–100.0)
Monocytes Absolute: 0.5 10*3/uL (ref 0.1–1.0)
Neutro Abs: 3.6 10*3/uL (ref 1.4–7.7)
RBC: 4.45 Mil/uL (ref 3.87–5.11)

## 2011-12-21 NOTE — Telephone Encounter (Signed)
I have spoken to patient and she states that she will come today or Monday for labs. I have advised her that I will send a 1 month rx for azulfadine until we get her labs. I have also advised that it is very important that she take the Azulfadine 500 mg 2 tablets twice daily EVERY day as directed. She verbalizes understanding.

## 2012-01-09 ENCOUNTER — Other Ambulatory Visit: Payer: Self-pay | Admitting: *Deleted

## 2012-01-09 MED ORDER — SULFASALAZINE 500 MG PO TABS: 1000.0000 mg | ORAL_TABLET | Freq: Two times a day (BID) | ORAL | Status: DC

## 2012-01-21 DIAGNOSIS — I10 Essential (primary) hypertension: Secondary | ICD-10-CM | POA: Diagnosis not present

## 2012-01-21 DIAGNOSIS — E7800 Pure hypercholesterolemia, unspecified: Secondary | ICD-10-CM | POA: Diagnosis not present

## 2012-01-22 ENCOUNTER — Other Ambulatory Visit: Payer: Self-pay | Admitting: Internal Medicine

## 2012-02-14 ENCOUNTER — Telehealth: Payer: Self-pay | Admitting: *Deleted

## 2012-02-14 NOTE — Telephone Encounter (Signed)
Line busy - will try again later

## 2012-02-14 NOTE — Telephone Encounter (Signed)
Spoke with patient and she will come next week for labs. 

## 2012-02-14 NOTE — Telephone Encounter (Signed)
Message copied by Daphine Deutscher on Thu Feb 14, 2012 10:52 AM ------      Message from: Daphine Deutscher      Created: Fri Dec 21, 2011  1:45 PM       Call and remind patient CBC due 02/18/12 DB

## 2012-02-18 ENCOUNTER — Other Ambulatory Visit (INDEPENDENT_AMBULATORY_CARE_PROVIDER_SITE_OTHER): Payer: Medicare Other

## 2012-02-18 ENCOUNTER — Encounter: Payer: Self-pay | Admitting: Internal Medicine

## 2012-02-18 DIAGNOSIS — D649 Anemia, unspecified: Secondary | ICD-10-CM

## 2012-02-18 LAB — CBC WITH DIFFERENTIAL/PLATELET
Basophils Absolute: 0 10*3/uL (ref 0.0–0.1)
Basophils Relative: 0.8 % (ref 0.0–3.0)
Eosinophils Absolute: 0.3 10*3/uL (ref 0.0–0.7)
HCT: 38 % (ref 36.0–46.0)
Hemoglobin: 12.6 g/dL (ref 12.0–15.0)
Lymphs Abs: 1.4 10*3/uL (ref 0.7–4.0)
MCHC: 33.1 g/dL (ref 30.0–36.0)
Neutro Abs: 4.3 10*3/uL (ref 1.4–7.7)
RDW: 15.5 % — ABNORMAL HIGH (ref 11.5–14.6)

## 2012-02-19 ENCOUNTER — Other Ambulatory Visit: Payer: Self-pay | Admitting: *Deleted

## 2012-02-19 DIAGNOSIS — D649 Anemia, unspecified: Secondary | ICD-10-CM

## 2012-02-25 ENCOUNTER — Other Ambulatory Visit: Payer: Self-pay | Admitting: Internal Medicine

## 2012-03-18 ENCOUNTER — Telehealth: Payer: Self-pay | Admitting: Internal Medicine

## 2012-03-18 NOTE — Telephone Encounter (Signed)
Attempted to call several times and keep getting busy signal.  Called pt and she is requesting a refill be faxed to the pharmacy for her sulfasalazine. Checking with Dottie regarding the refill.

## 2012-03-18 NOTE — Telephone Encounter (Signed)
Pt has not been seen in a year. Pt will need to schedule an OV before we refill the med. Pt scheduled to see Dr. Juanda Chance 7/24@2pm . Pt aware of appt date and time.

## 2012-04-02 ENCOUNTER — Encounter: Payer: Self-pay | Admitting: Internal Medicine

## 2012-04-02 ENCOUNTER — Ambulatory Visit (INDEPENDENT_AMBULATORY_CARE_PROVIDER_SITE_OTHER): Payer: Medicare Other | Admitting: Internal Medicine

## 2012-04-02 ENCOUNTER — Other Ambulatory Visit (INDEPENDENT_AMBULATORY_CARE_PROVIDER_SITE_OTHER): Payer: Medicare Other

## 2012-04-02 VITALS — BP 112/70 | HR 80 | Ht 66.0 in | Wt 172.0 lb

## 2012-04-02 DIAGNOSIS — D649 Anemia, unspecified: Secondary | ICD-10-CM

## 2012-04-02 DIAGNOSIS — R195 Other fecal abnormalities: Secondary | ICD-10-CM

## 2012-04-02 LAB — CBC WITH DIFFERENTIAL/PLATELET
Basophils Absolute: 0 10*3/uL (ref 0.0–0.1)
Eosinophils Relative: 4.3 % (ref 0.0–5.0)
HCT: 39.8 % (ref 36.0–46.0)
Lymphocytes Relative: 20.1 % (ref 12.0–46.0)
Lymphs Abs: 1.5 10*3/uL (ref 0.7–4.0)
Monocytes Relative: 8.6 % (ref 3.0–12.0)
Neutrophils Relative %: 66.7 % (ref 43.0–77.0)
Platelets: 266 10*3/uL (ref 150.0–400.0)
RDW: 13.4 % (ref 11.5–14.6)
WBC: 7.3 10*3/uL (ref 4.5–10.5)

## 2012-04-02 LAB — COMPREHENSIVE METABOLIC PANEL
ALT: 18 U/L (ref 0–35)
BUN: 13 mg/dL (ref 6–23)
CO2: 31 mEq/L (ref 19–32)
Creatinine, Ser: 0.7 mg/dL (ref 0.4–1.2)
GFR: 91.19 mL/min (ref >60.00)
Total Bilirubin: 0.5 mg/dL (ref 0.3–1.2)

## 2012-04-02 LAB — IBC PANEL
Saturation Ratios: 80.4 % — ABNORMAL HIGH (ref 20.0–50.0)
Transferrin: 247.9 mg/dL (ref 212.0–360.0)

## 2012-04-02 LAB — URINALYSIS, ROUTINE W REFLEX MICROSCOPIC
Nitrite: NEGATIVE
Specific Gravity, Urine: 1.02 (ref 1.000–1.030)
Total Protein, Urine: NEGATIVE
pH: 6 (ref 5.0–8.0)

## 2012-04-02 MED ORDER — SULFASALAZINE 500 MG PO TABS: 1000.0000 mg | ORAL_TABLET | Freq: Two times a day (BID) | ORAL | Status: DC

## 2012-04-02 NOTE — Progress Notes (Signed)
Cassandra Garcia 08-15-1935 MRN [id number]   History of Present Illness:  This is a 77 year old white female with chronic GI blood loss with presumed inflammatory bowel disease although her IBD markers have been negative. She had a positive small bowel capsule endoscopy in 2009 which showed ulcerations in the small bowel. She had an iron infusion (Feraheme) in February 2013 complicated by a mild reaction. An upper endoscopy and colonoscopy in 2009 were negative. She has severe diverticulosis of the left colon. Her last hemoglobin on the 02/18/2012 was 12.6 with a hematocrit of 38.0. A prior hemoglobin in February 2013 was 10.9 with a hematocrit of 34.1. A CT scan of the abdomen and pelvis in March 2009 showed diverticulosis. She is on Azulfidine 1 g twice a day and folic acid 1 mg and day. She takes iron supplements daily. She denies abdominal pain or diarrhea.   Past Medical History  Diagnosis Date  . Anemia   . Nephrolithiasis   . Acute asthmatic bronchitis   . Hypertension   . Diverticulosis of colon   . Nausea   . Abdominal pain, left lower quadrant   . Crohn's disease    Past Surgical History  Procedure Date  . Vaginal hysterectomy     reports that she has never smoked. She has never used smokeless tobacco. She reports that she does not drink alcohol or use illicit drugs. family history includes Esophageal cancer in her father; Leukemia in her brother; and Liver cancer in her brother. Allergies  Allergen Reactions  . Iron Dextran Diarrhea and Nausea And Vomiting        Review of Systems: Denies heartburn chest pain shortness of breath  The remainder of the 10 point ROS is negative except as outlined in H&P   Physical Exam: General appearance  Well developed, in no distress. Eyes- non icteric. HEENT nontraumatic, normocephalic. Mouth no lesions, tongue papillated, no cheilosis. Neck supple without adenopathy, thyroid not enlarged, no carotid bruits, no JVD. Lungs Clear to  auscultation bilaterally. Cor normal S1, normal S2, regular rhythm, no murmur,  quiet precordium. Abdomen: Soft mildly tender in left lower quadrant. Normoactive bowel sounds. No distention. Liver edge at costal margin. Rectal: Soft Hemoccult-positive stool. Extremities no pedal edema. Skin no lesions. Neurological alert and oriented x 3. Psychological normal mood and affect.  Assessment and Plan:  Problem #1 Chronic ongoing GI blood loss likely from small bowel ulcerations from inflammatory bowel disease. We will recheck her blood count today and refill her sulfasalazine. She may need iron infusions periodically.   04/02/2012 [name]

## 2012-04-02 NOTE — Patient Instructions (Addendum)
We have sent the following medications to your pharmacy for you to pick up at your convenience: Sulfasalazine Your physician has requested that you go to the basement for the following lab work before leaving today: CMET, CBC, IBC, urinalysis CC: Tomi Bamberger, NP

## 2012-04-04 ENCOUNTER — Other Ambulatory Visit: Payer: Self-pay | Admitting: *Deleted

## 2012-04-04 DIAGNOSIS — D649 Anemia, unspecified: Secondary | ICD-10-CM

## 2012-04-22 ENCOUNTER — Other Ambulatory Visit: Payer: Self-pay | Admitting: Internal Medicine

## 2012-05-02 DIAGNOSIS — E785 Hyperlipidemia, unspecified: Secondary | ICD-10-CM | POA: Diagnosis not present

## 2012-05-02 DIAGNOSIS — I10 Essential (primary) hypertension: Secondary | ICD-10-CM | POA: Diagnosis not present

## 2012-05-02 DIAGNOSIS — E559 Vitamin D deficiency, unspecified: Secondary | ICD-10-CM | POA: Diagnosis not present

## 2012-05-02 DIAGNOSIS — K5090 Crohn's disease, unspecified, without complications: Secondary | ICD-10-CM | POA: Diagnosis not present

## 2012-05-02 DIAGNOSIS — J45909 Unspecified asthma, uncomplicated: Secondary | ICD-10-CM | POA: Diagnosis not present

## 2012-05-29 ENCOUNTER — Telehealth: Payer: Self-pay | Admitting: *Deleted

## 2012-05-29 NOTE — Telephone Encounter (Signed)
Spoke with patient and she will come next week for labs. 

## 2012-05-29 NOTE — Telephone Encounter (Signed)
Message copied by Daphine Deutscher on Thu May 29, 2012  3:59 PM ------      Message from: Daphine Deutscher      Created: Fri Apr 04, 2012  9:25 AM       Call and remind CBC, iron panel due 9/23 DB

## 2012-06-02 ENCOUNTER — Other Ambulatory Visit (INDEPENDENT_AMBULATORY_CARE_PROVIDER_SITE_OTHER): Payer: Medicare Other

## 2012-06-02 ENCOUNTER — Other Ambulatory Visit: Payer: Self-pay | Admitting: Internal Medicine

## 2012-06-02 DIAGNOSIS — D649 Anemia, unspecified: Secondary | ICD-10-CM

## 2012-06-02 DIAGNOSIS — R7989 Other specified abnormal findings of blood chemistry: Secondary | ICD-10-CM

## 2012-06-02 LAB — CBC WITH DIFFERENTIAL/PLATELET
Basophils Absolute: 0 10*3/uL (ref 0.0–0.1)
Eosinophils Absolute: 0.3 10*3/uL (ref 0.0–0.7)
Lymphocytes Relative: 19.6 % (ref 12.0–46.0)
MCHC: 32.2 g/dL (ref 30.0–36.0)
MCV: 97.6 fl (ref 78.0–100.0)
Monocytes Absolute: 0.6 10*3/uL (ref 0.1–1.0)
Neutrophils Relative %: 65 % (ref 43.0–77.0)
Platelets: 246 10*3/uL (ref 150.0–400.0)

## 2012-06-02 LAB — IBC PANEL
Iron: 79 ug/dL (ref 42–145)
Transferrin: 245.9 mg/dL (ref 212.0–360.0)

## 2012-07-22 DIAGNOSIS — Z23 Encounter for immunization: Secondary | ICD-10-CM | POA: Diagnosis not present

## 2012-08-01 DIAGNOSIS — E669 Obesity, unspecified: Secondary | ICD-10-CM | POA: Diagnosis not present

## 2012-08-01 DIAGNOSIS — E785 Hyperlipidemia, unspecified: Secondary | ICD-10-CM | POA: Diagnosis not present

## 2012-08-01 DIAGNOSIS — I10 Essential (primary) hypertension: Secondary | ICD-10-CM | POA: Diagnosis not present

## 2012-08-01 DIAGNOSIS — E559 Vitamin D deficiency, unspecified: Secondary | ICD-10-CM | POA: Diagnosis not present

## 2012-08-21 ENCOUNTER — Other Ambulatory Visit (INDEPENDENT_AMBULATORY_CARE_PROVIDER_SITE_OTHER): Payer: Medicare Other

## 2012-08-21 ENCOUNTER — Telehealth: Payer: Self-pay | Admitting: Internal Medicine

## 2012-08-21 DIAGNOSIS — D649 Anemia, unspecified: Secondary | ICD-10-CM | POA: Diagnosis not present

## 2012-08-21 LAB — CBC WITH DIFFERENTIAL/PLATELET
Basophils Absolute: 0 10*3/uL (ref 0.0–0.1)
Eosinophils Absolute: 0.3 10*3/uL (ref 0.0–0.7)
Lymphocytes Relative: 22 % (ref 12.0–46.0)
MCHC: 32.7 g/dL (ref 30.0–36.0)
Neutro Abs: 4.6 10*3/uL (ref 1.4–7.7)
Neutrophils Relative %: 65.1 % (ref 43.0–77.0)
Platelets: 278 10*3/uL (ref 150.0–400.0)
RDW: 13.4 % (ref 11.5–14.6)

## 2012-08-21 MED ORDER — SULFASALAZINE 500 MG PO TABS: 1000.0000 mg | ORAL_TABLET | Freq: Two times a day (BID) | ORAL | Status: DC

## 2012-08-21 MED ORDER — DICYCLOMINE HCL 10 MG PO CAPS: 10.0000 mg | ORAL_CAPSULE | Freq: Two times a day (BID) | ORAL | Status: DC

## 2012-08-21 NOTE — Telephone Encounter (Signed)
Dr Juanda Chance- Does patient need follow up office visit for anemia now? She was last seen 03/2012.

## 2012-08-21 NOTE — Telephone Encounter (Signed)
Patient advised that she needs labwork completed. I have sent refills for Bentyl and sulfasalazine to her pharmacy. She states that she will come for labs today.

## 2012-08-21 NOTE — Telephone Encounter (Signed)
She does not need to be seen in the office till July 2014 but she is due for 3 month CBC,Iron studies.

## 2012-08-22 ENCOUNTER — Other Ambulatory Visit: Payer: Self-pay | Admitting: *Deleted

## 2012-08-22 DIAGNOSIS — D649 Anemia, unspecified: Secondary | ICD-10-CM

## 2012-10-18 ENCOUNTER — Other Ambulatory Visit: Payer: Self-pay | Admitting: Internal Medicine

## 2012-10-21 ENCOUNTER — Other Ambulatory Visit: Payer: Self-pay | Admitting: Internal Medicine

## 2012-11-07 ENCOUNTER — Other Ambulatory Visit: Payer: Self-pay | Admitting: Internal Medicine

## 2012-11-13 ENCOUNTER — Telehealth: Payer: Self-pay | Admitting: *Deleted

## 2012-11-13 NOTE — Telephone Encounter (Signed)
Message copied by Daphine Deutscher on Thu Nov 13, 2012  8:29 AM ------      Message from: Daphine Deutscher      Created: Fri Aug 22, 2012 11:16 AM       Call and remind patient due for CBC on 11/17/12 for DB ------

## 2012-11-13 NOTE — Telephone Encounter (Signed)
Spoke with patient and she will come back next week for labs.

## 2012-11-27 ENCOUNTER — Other Ambulatory Visit (INDEPENDENT_AMBULATORY_CARE_PROVIDER_SITE_OTHER): Payer: Medicare Other

## 2012-11-27 ENCOUNTER — Telehealth: Payer: Self-pay | Admitting: *Deleted

## 2012-11-27 DIAGNOSIS — D649 Anemia, unspecified: Secondary | ICD-10-CM

## 2012-11-27 LAB — CBC WITH DIFFERENTIAL/PLATELET
Basophils Relative: 0.6 % (ref 0.0–3.0)
Eosinophils Relative: 3.6 % (ref 0.0–5.0)
Hemoglobin: 13.3 g/dL (ref 12.0–15.0)
MCV: 95.1 fl (ref 78.0–100.0)
Monocytes Absolute: 0.6 10*3/uL (ref 0.1–1.0)
Neutro Abs: 5.5 10*3/uL (ref 1.4–7.7)
Neutrophils Relative %: 72.4 % (ref 43.0–77.0)
RBC: 4.18 Mil/uL (ref 3.87–5.11)
WBC: 7.6 10*3/uL (ref 4.5–10.5)

## 2012-11-27 LAB — IBC PANEL
Saturation Ratios: 40.4 % (ref 20.0–50.0)
Transferrin: 230 mg/dL (ref 212.0–360.0)

## 2012-11-27 NOTE — Telephone Encounter (Signed)
Message copied by Daphine Deutscher on Thu Nov 27, 2012  8:22 AM ------      Message from: Daphine Deutscher      Created: Thu Nov 13, 2012  8:31 AM       Did patient have CBC for DB ------

## 2012-11-27 NOTE — Telephone Encounter (Signed)
Spoke with patient and she will come today for labs.

## 2012-11-28 ENCOUNTER — Other Ambulatory Visit: Payer: Self-pay | Admitting: *Deleted

## 2012-11-28 DIAGNOSIS — D649 Anemia, unspecified: Secondary | ICD-10-CM

## 2012-12-08 ENCOUNTER — Telehealth: Payer: Self-pay | Admitting: Internal Medicine

## 2012-12-08 MED ORDER — DICYCLOMINE HCL 10 MG PO CAPS: 10.0000 mg | ORAL_CAPSULE | Freq: Two times a day (BID) | ORAL | Status: DC

## 2012-12-08 MED ORDER — FOLIC ACID 1 MG PO TABS: ORAL_TABLET | ORAL | Status: DC

## 2012-12-08 NOTE — Telephone Encounter (Signed)
Called patient and patient's line was busy.

## 2012-12-08 NOTE — Telephone Encounter (Signed)
Pt aware that rx's have been sent

## 2012-12-15 ENCOUNTER — Telehealth: Payer: Self-pay | Admitting: Internal Medicine

## 2012-12-15 MED ORDER — DICYCLOMINE HCL 10 MG PO CAPS: 10.0000 mg | ORAL_CAPSULE | Freq: Two times a day (BID) | ORAL | Status: DC

## 2012-12-15 NOTE — Telephone Encounter (Signed)
Patient states that she is out of Bentyl and her mail in pharmacy has not sent it out to her address yet. Patient requests #30 tablets be sent to her local pharmacy as she will have to pay out of pocket until her medication comes from mail in pharmacy. Rx sent and patient advised that she will be due for office visit in 03/2013.

## 2013-01-08 ENCOUNTER — Other Ambulatory Visit: Payer: Self-pay | Admitting: Internal Medicine

## 2013-02-03 DIAGNOSIS — E785 Hyperlipidemia, unspecified: Secondary | ICD-10-CM | POA: Diagnosis not present

## 2013-02-03 DIAGNOSIS — I10 Essential (primary) hypertension: Secondary | ICD-10-CM | POA: Diagnosis not present

## 2013-02-03 DIAGNOSIS — E669 Obesity, unspecified: Secondary | ICD-10-CM | POA: Diagnosis not present

## 2013-02-03 DIAGNOSIS — K5090 Crohn's disease, unspecified, without complications: Secondary | ICD-10-CM | POA: Diagnosis not present

## 2013-02-03 DIAGNOSIS — E559 Vitamin D deficiency, unspecified: Secondary | ICD-10-CM | POA: Diagnosis not present

## 2013-02-03 DIAGNOSIS — Z79899 Other long term (current) drug therapy: Secondary | ICD-10-CM | POA: Diagnosis not present

## 2013-03-04 ENCOUNTER — Telehealth: Payer: Self-pay | Admitting: Internal Medicine

## 2013-03-04 NOTE — Telephone Encounter (Signed)
NOTED

## 2013-03-26 ENCOUNTER — Telehealth: Payer: Self-pay | Admitting: *Deleted

## 2013-03-26 NOTE — Telephone Encounter (Signed)
Message copied by Daphine Deutscher on Thu Mar 26, 2013 11:11 AM ------      Message from: Daphine Deutscher      Created: Fri Nov 28, 2012 11:20 AM       Call and remind due for CBC, IBC panel on 03/30/13 DB ------

## 2013-03-26 NOTE — Telephone Encounter (Signed)
Spoke with patient and she will come for labs. 

## 2013-03-26 NOTE — Telephone Encounter (Signed)
Line busy. Will try again later.

## 2013-03-26 NOTE — Telephone Encounter (Signed)
Left a message for patient to call me. 

## 2013-03-30 ENCOUNTER — Other Ambulatory Visit (INDEPENDENT_AMBULATORY_CARE_PROVIDER_SITE_OTHER): Payer: Medicare Other

## 2013-03-30 DIAGNOSIS — D649 Anemia, unspecified: Secondary | ICD-10-CM | POA: Diagnosis not present

## 2013-03-30 LAB — CBC WITH DIFFERENTIAL/PLATELET
Basophils Relative: 0.6 % (ref 0.0–3.0)
Eosinophils Relative: 4 % (ref 0.0–5.0)
HCT: 41.5 % (ref 36.0–46.0)
Hemoglobin: 13.9 g/dL (ref 12.0–15.0)
Lymphs Abs: 1.3 10*3/uL (ref 0.7–4.0)
MCV: 99.8 fl (ref 78.0–100.0)
Monocytes Absolute: 0.6 10*3/uL (ref 0.1–1.0)
Neutro Abs: 4.9 10*3/uL (ref 1.4–7.7)
RBC: 4.16 Mil/uL (ref 3.87–5.11)
WBC: 7.1 10*3/uL (ref 4.5–10.5)

## 2013-03-30 LAB — IBC PANEL: Transferrin: 222.3 mg/dL (ref 212.0–360.0)

## 2013-03-31 ENCOUNTER — Encounter: Payer: Self-pay | Admitting: Internal Medicine

## 2013-03-31 ENCOUNTER — Ambulatory Visit (INDEPENDENT_AMBULATORY_CARE_PROVIDER_SITE_OTHER): Payer: Medicare Other | Admitting: Internal Medicine

## 2013-03-31 VITALS — BP 118/64 | HR 100 | Ht 66.0 in | Wt 166.0 lb

## 2013-03-31 DIAGNOSIS — R933 Abnormal findings on diagnostic imaging of other parts of digestive tract: Secondary | ICD-10-CM

## 2013-03-31 DIAGNOSIS — R195 Other fecal abnormalities: Secondary | ICD-10-CM | POA: Diagnosis not present

## 2013-03-31 MED ORDER — FOLIC ACID 1 MG PO TABS: ORAL_TABLET | ORAL | Status: DC

## 2013-03-31 NOTE — Patient Instructions (Addendum)
We have sent the following medications to your pharmacy for you to pick up at your convenience: Folic Acid  You will be due for a recall colonoscopy in 06/2013. We will send you a reminder in the mail when it gets closer to that time.  CC:Dr Tomi Bamberger

## 2013-03-31 NOTE — Progress Notes (Signed)
Cassandra Garcia [birth year] MRN [id number]   History of Present Illness:  This is a 78 year old white female with chronic GI blood loss presumably from inflammatory bowel disease. She had a positive small bowel capsule endoscopy in 2009 showing small bowel ulcerations. She is always Hemoccult positive on my exam. Her last office visit was July 2013 and she has done extremely well on azulfadine 1 g twice a day and folic acid 1 mg daily. She currently denies any abdominal pain or diarrhea. Her last hemoglobin was 13.3 and her hematocrit was 39.7 with 40% iron saturation. At one point, her hemoglobin was down to 7.0. Her last iron infusion was in February 2013 and she had a mild reaction. She is currently on oral iron.   Past Medical History  Diagnosis Date  . Anemia   . Nephrolithiasis   . Acute asthmatic bronchitis   . Hypertension   . Diverticulosis of colon   . Nausea   . Abdominal pain, left lower quadrant   . Crohn's disease    Past Surgical History  Procedure Laterality Date  . Vaginal hysterectomy      reports that she has never smoked. She has never used smokeless tobacco. She reports that she does not drink alcohol or use illicit drugs. family history includes Esophageal cancer in her father; Leukemia in her brother; and Liver cancer in her brother. Allergies  Allergen Reactions  . Iron Dextran Diarrhea and Nausea And Vomiting        Review of Systems: Denies dysphagia heartburn or weight change  The remainder of the 10 point ROS is negative except as outlined in H&P   Physical Exam: General appearance  Well developed, in no distress. Eyes- non icteric. HEENT nontraumatic, normocephalic. Mouth no lesions, tongue papillated, no cheilosis. Neck supple without adenopathy, thyroid not enlarged, no carotid bruits, no JVD. Lungs Clear to auscultation bilaterally. Cor normal S1, normal S2, regular rhythm, no murmur,  quiet precordium. Abdomen: Soft nontender with normal  active bowel sounds. Rectal: Manson Passey formed Hemoccult positive stool. Extremities no pedal edema. Skin no lesions. Neurological alert and oriented x 3. Psychological normal mood and affect.  Assessment and Plan:  Problem #86 78 year old white female with Crohn's disease of the distal ileum causing low-grade GI blood loss. She is otherwise asymptomatic. There is no stricture or obstruction. We will continue azulfidine which is of questionable significance in the treatment of small bowel Crohn's but she remains asymptomatic on this medicine and her hemoglobin is maintained on iron supplements. I will see her in one year. She will be due for colonoscopy this year. We will send her a recall letter in October 2014.   03/31/2013 [name]

## 2013-05-20 ENCOUNTER — Encounter: Payer: Self-pay | Admitting: Internal Medicine

## 2013-05-27 ENCOUNTER — Telehealth: Payer: Self-pay | Admitting: Internal Medicine

## 2013-05-27 MED ORDER — DICYCLOMINE HCL 10 MG PO CAPS: 10.0000 mg | ORAL_CAPSULE | Freq: Two times a day (BID) | ORAL | Status: DC

## 2013-05-27 NOTE — Telephone Encounter (Signed)
rx sent

## 2013-05-27 NOTE — Telephone Encounter (Signed)
rx was already sent this morning. Left message

## 2013-06-17 ENCOUNTER — Other Ambulatory Visit: Payer: Self-pay | Admitting: Internal Medicine

## 2013-07-13 DIAGNOSIS — Z23 Encounter for immunization: Secondary | ICD-10-CM | POA: Diagnosis not present

## 2013-07-23 ENCOUNTER — Other Ambulatory Visit: Payer: Self-pay | Admitting: Internal Medicine

## 2013-08-14 ENCOUNTER — Other Ambulatory Visit: Payer: Self-pay | Admitting: Internal Medicine

## 2013-08-28 ENCOUNTER — Telehealth: Payer: Self-pay | Admitting: Internal Medicine

## 2013-08-28 DIAGNOSIS — K529 Noninfective gastroenteritis and colitis, unspecified: Secondary | ICD-10-CM

## 2013-08-28 DIAGNOSIS — R194 Change in bowel habit: Secondary | ICD-10-CM

## 2013-08-28 NOTE — Telephone Encounter (Signed)
Spoke with patient and she is asking about scheduling a colonoscopy Scheduled recall colonoscopy on 11/04/13 at 10:30 AM and pre visit on 10/28/13 at 1:30 PM.

## 2013-09-01 ENCOUNTER — Other Ambulatory Visit: Payer: Self-pay | Admitting: Internal Medicine

## 2013-09-14 ENCOUNTER — Telehealth: Payer: Self-pay | Admitting: Internal Medicine

## 2013-09-14 MED ORDER — DICYCLOMINE HCL 10 MG PO CAPS: 10.0000 mg | ORAL_CAPSULE | Freq: Two times a day (BID) | ORAL | Status: DC

## 2013-09-14 NOTE — Telephone Encounter (Signed)
rx sent

## 2013-09-26 IMAGING — CR DG CHEST 2V
2 series · 2 of 2 positions shown · non-contrast
Comparison: 01/14/2009.

CLINICAL DATA: Cough.  Congestion.  History of pneumonia.

CHEST - 2 VIEW

[view not recorded (1 of 2)]
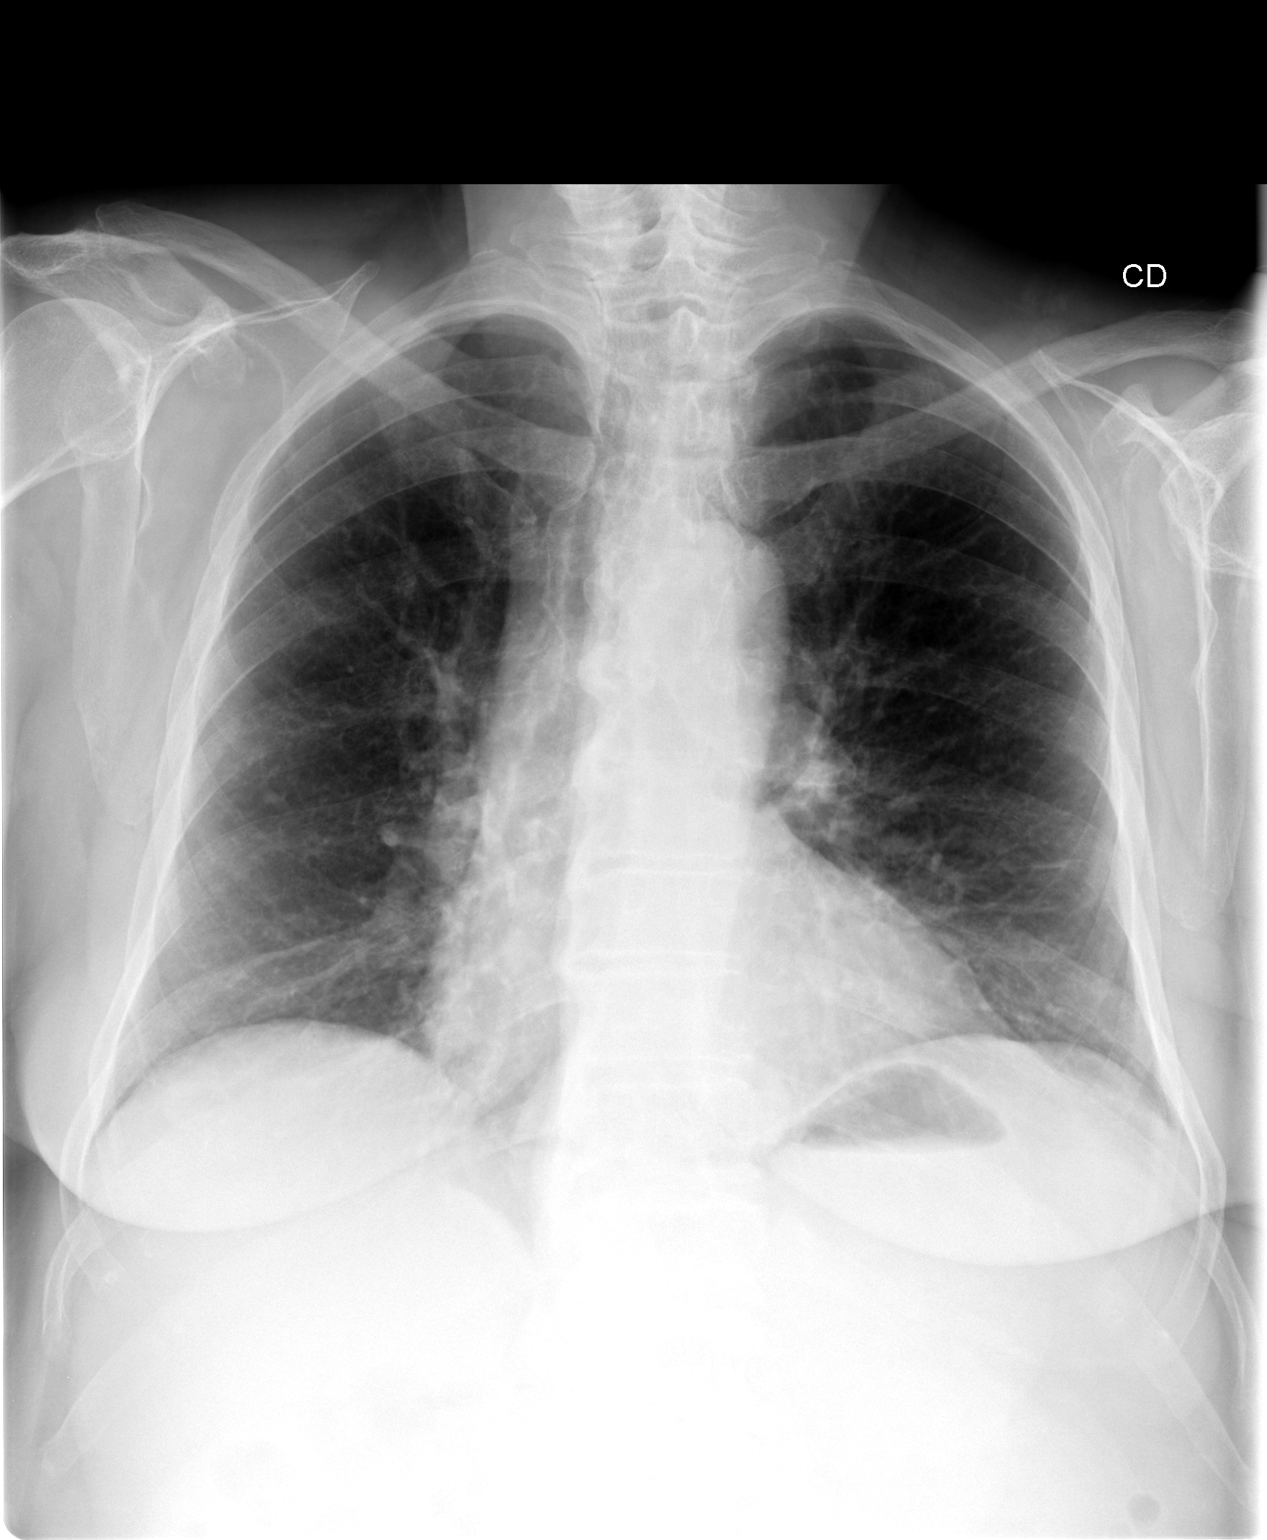

[view not recorded (2 of 2)]
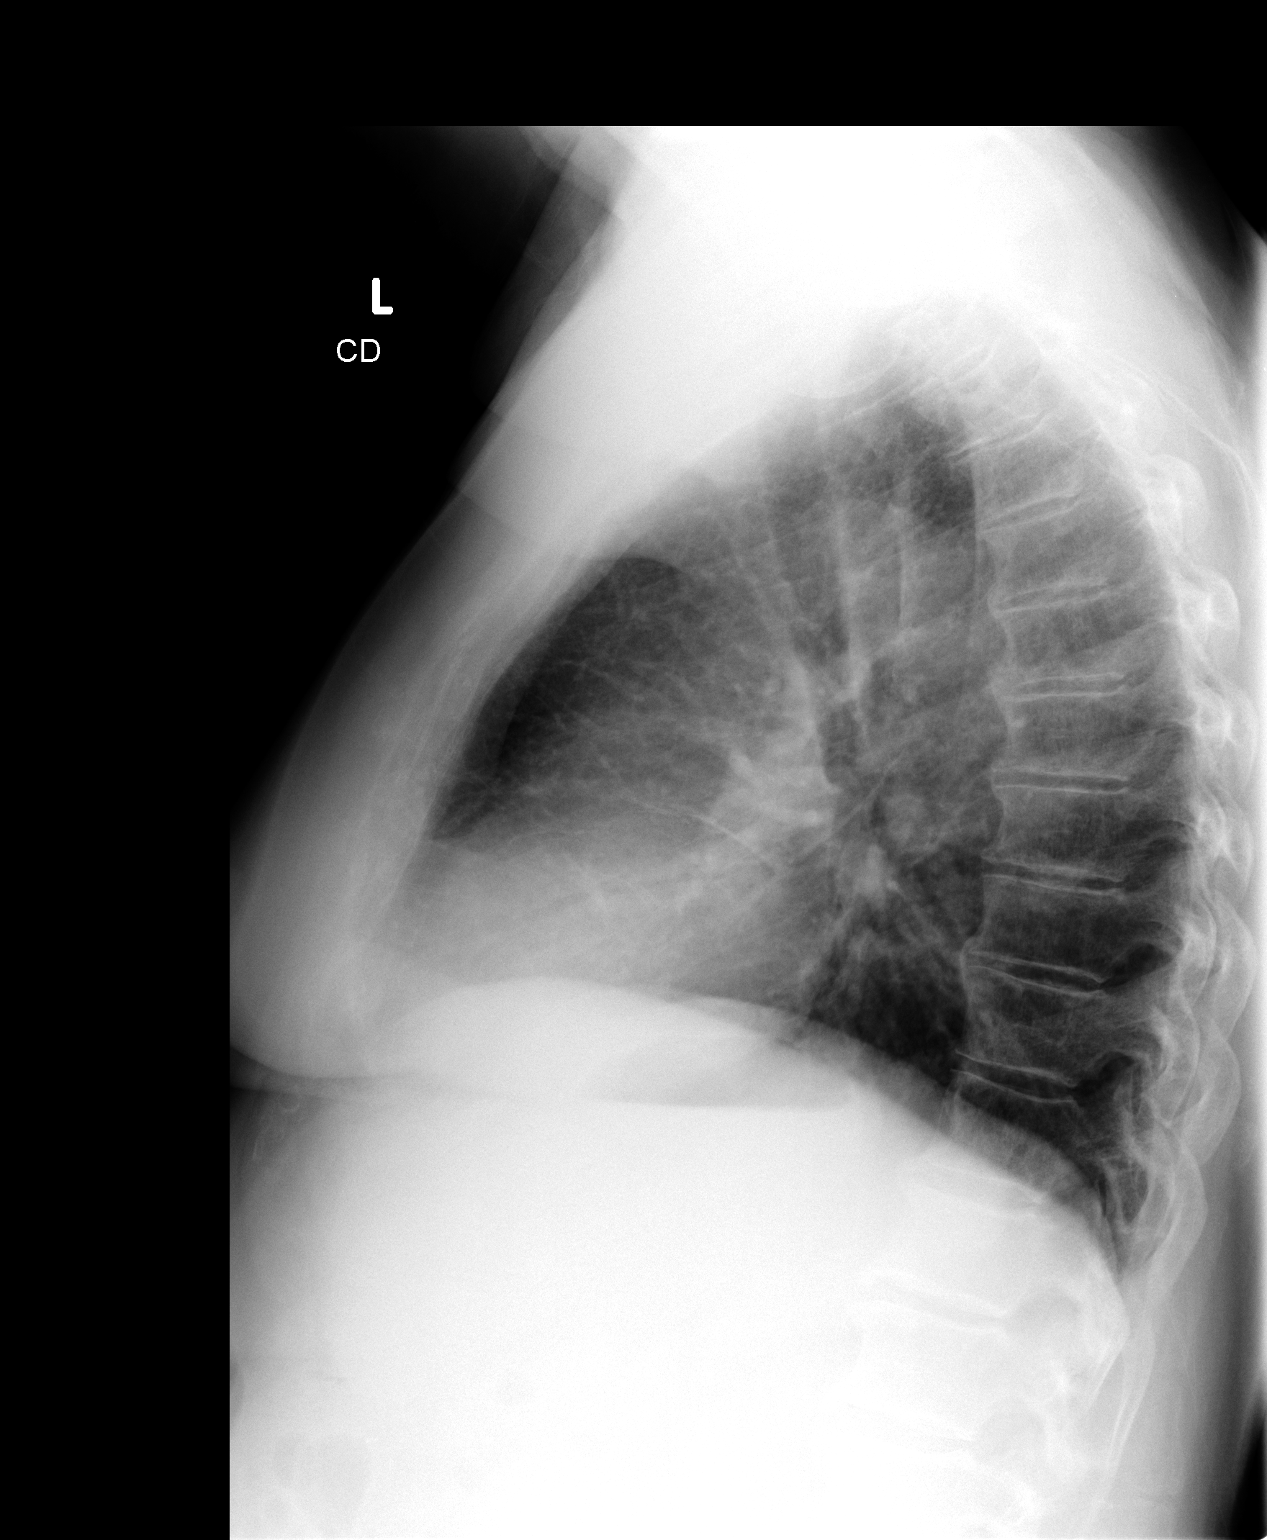

[2 of 2 positions shown; findings below may reference images not displayed]

FINDINGS: Cardiopericardial silhouette upper limits of normal for
projection.  Tortuous thoracic aorta with unchanged mediastinal
contours compared to prior.  Trachea midline.  Bilateral basilar
atelectasis.  Nonspecific interstitial changes.  Small hiatal
hernia.  Thoracic spine DISH.
IMPRESSION: No acute cardiopulmonary disease.  Chronic bilateral basilar
atelectasis and / or scarring.  Unchanged small retrocardiac
density compatible with hiatal hernia.

## 2013-09-28 ENCOUNTER — Other Ambulatory Visit: Payer: Self-pay | Admitting: Internal Medicine

## 2013-09-30 ENCOUNTER — Telehealth: Payer: Self-pay | Admitting: Internal Medicine

## 2013-09-30 NOTE — Telephone Encounter (Signed)
Patient advised that these rx's were sent to [facility] on 09-28-13 electronically. She will call back if she has any further issues getting her prescription.

## 2013-11-04 ENCOUNTER — Encounter: Payer: Medicare Other | Admitting: Internal Medicine

## 2013-11-27 ENCOUNTER — Encounter: Payer: Self-pay | Admitting: Internal Medicine

## 2013-12-02 ENCOUNTER — Encounter: Payer: Self-pay | Admitting: Internal Medicine

## 2013-12-03 ENCOUNTER — Other Ambulatory Visit (INDEPENDENT_AMBULATORY_CARE_PROVIDER_SITE_OTHER): Payer: Medicare Other

## 2013-12-03 ENCOUNTER — Telehealth: Payer: Self-pay | Admitting: Internal Medicine

## 2013-12-03 DIAGNOSIS — D509 Iron deficiency anemia, unspecified: Secondary | ICD-10-CM

## 2013-12-03 DIAGNOSIS — E538 Deficiency of other specified B group vitamins: Secondary | ICD-10-CM

## 2013-12-03 LAB — CBC WITH DIFFERENTIAL/PLATELET
BASOS ABS: 0 10*3/uL (ref 0.0–0.1)
Basophils Relative: 0.5 % (ref 0.0–3.0)
Eosinophils Absolute: 0.3 10*3/uL (ref 0.0–0.7)
Eosinophils Relative: 3.6 % (ref 0.0–5.0)
HCT: 38.5 % (ref 36.0–46.0)
Hemoglobin: 12.8 g/dL (ref 12.0–15.0)
LYMPHS PCT: 15 % (ref 12.0–46.0)
Lymphs Abs: 1.1 10*3/uL (ref 0.7–4.0)
MCHC: 33.2 g/dL (ref 30.0–36.0)
MCV: 96.9 fl (ref 78.0–100.0)
Monocytes Absolute: 0.6 10*3/uL (ref 0.1–1.0)
Monocytes Relative: 7.9 % (ref 3.0–12.0)
NEUTROS PCT: 73 % (ref 43.0–77.0)
Neutro Abs: 5.2 10*3/uL (ref 1.4–7.7)
Platelets: 264 10*3/uL (ref 150.0–400.0)
RBC: 3.97 Mil/uL (ref 3.87–5.11)
RDW: 14.1 % (ref 11.5–14.6)
WBC: 7.1 10*3/uL (ref 4.5–10.5)

## 2013-12-03 LAB — IBC PANEL
IRON: 60 ug/dL (ref 42–145)
SATURATION RATIOS: 18 % — AB (ref 20.0–50.0)
TRANSFERRIN: 238.2 mg/dL (ref 212.0–360.0)

## 2013-12-03 LAB — VITAMIN B12: Vitamin B-12: 483 pg/mL (ref 211–911)

## 2013-12-03 NOTE — Telephone Encounter (Signed)
Patient is asking if she is due for labs. She has scheduled her colonoscopy in May. Please, advise.

## 2013-12-03 NOTE — Telephone Encounter (Signed)
Patient notified. Labs in EPIC.

## 2013-12-03 NOTE — Telephone Encounter (Signed)
Yes, last CBC 03/2013. She can come any time for CBC,Iron studies, B12,

## 2013-12-04 ENCOUNTER — Other Ambulatory Visit: Payer: Self-pay | Admitting: *Deleted

## 2013-12-04 DIAGNOSIS — D509 Iron deficiency anemia, unspecified: Secondary | ICD-10-CM

## 2014-01-22 ENCOUNTER — Ambulatory Visit (AMBULATORY_SURGERY_CENTER): Payer: Self-pay | Admitting: *Deleted

## 2014-01-22 VITALS — Ht 65.0 in | Wt 161.8 lb

## 2014-01-22 DIAGNOSIS — Z1211 Encounter for screening for malignant neoplasm of colon: Secondary | ICD-10-CM

## 2014-01-22 MED ORDER — MOVIPREP 100 G PO SOLR: 1.0000 | Freq: Once | ORAL | Status: DC

## 2014-01-22 NOTE — Progress Notes (Signed)
Denies allergies to eggs or soy products. Denies complications with sedation or anesthesia. Denies O2 use. Denies use of diet or weight loss medications.  Emmi instructions given for colonoscopy.  

## 2014-01-26 ENCOUNTER — Other Ambulatory Visit: Payer: Self-pay | Admitting: Internal Medicine

## 2014-01-27 ENCOUNTER — Telehealth: Payer: Self-pay | Admitting: Internal Medicine

## 2014-01-27 NOTE — Telephone Encounter (Signed)
Spoke with patient and told her she is due on 02/03/14 for repeat labs.

## 2014-02-03 ENCOUNTER — Other Ambulatory Visit (INDEPENDENT_AMBULATORY_CARE_PROVIDER_SITE_OTHER): Payer: Medicare Other

## 2014-02-03 DIAGNOSIS — D509 Iron deficiency anemia, unspecified: Secondary | ICD-10-CM | POA: Diagnosis not present

## 2014-02-03 LAB — CBC WITH DIFFERENTIAL/PLATELET
Basophils Absolute: 0 10*3/uL (ref 0.0–0.1)
Basophils Relative: 0.5 % (ref 0.0–3.0)
EOS ABS: 0.2 10*3/uL (ref 0.0–0.7)
Eosinophils Relative: 2.8 % (ref 0.0–5.0)
HCT: 38.5 % (ref 36.0–46.0)
Hemoglobin: 13 g/dL (ref 12.0–15.0)
LYMPHS PCT: 16.5 % (ref 12.0–46.0)
Lymphs Abs: 1.2 10*3/uL (ref 0.7–4.0)
MCHC: 33.8 g/dL (ref 30.0–36.0)
MCV: 98.2 fl (ref 78.0–100.0)
MONO ABS: 0.4 10*3/uL (ref 0.1–1.0)
Monocytes Relative: 5.3 % (ref 3.0–12.0)
NEUTROS ABS: 5.5 10*3/uL (ref 1.4–7.7)
NEUTROS PCT: 74.9 % (ref 43.0–77.0)
Platelets: 250 10*3/uL (ref 150.0–400.0)
RBC: 3.92 Mil/uL (ref 3.87–5.11)
RDW: 13.9 % (ref 11.5–15.5)
WBC: 7.4 10*3/uL (ref 4.0–10.5)

## 2014-02-04 ENCOUNTER — Other Ambulatory Visit: Payer: Self-pay | Admitting: *Deleted

## 2014-02-04 DIAGNOSIS — D649 Anemia, unspecified: Secondary | ICD-10-CM

## 2014-02-05 ENCOUNTER — Encounter: Payer: Medicare Other | Admitting: Internal Medicine

## 2014-02-08 ENCOUNTER — Telehealth: Payer: Self-pay | Admitting: Internal Medicine

## 2014-02-08 MED ORDER — DICYCLOMINE HCL 10 MG PO CAPS: ORAL_CAPSULE | ORAL | Status: DC

## 2014-02-08 NOTE — Telephone Encounter (Signed)
Patient has scheduled an appointment. Rx for bentyl has been sent to pharmacy until her appointment.

## 2014-02-08 NOTE — Telephone Encounter (Signed)
Left message for patient to call back. We have not received any correspondence from mail order pharmacy to request new script. She needs office visit.

## 2014-03-23 ENCOUNTER — Ambulatory Visit: Payer: Medicare Other | Admitting: Internal Medicine

## 2014-03-24 ENCOUNTER — Telehealth: Payer: Self-pay | Admitting: Internal Medicine

## 2014-03-24 MED ORDER — DICYCLOMINE HCL 10 MG PO CAPS: ORAL_CAPSULE | ORAL | Status: DC

## 2014-03-24 MED ORDER — SULFASALAZINE 500 MG PO TABS: ORAL_TABLET | ORAL | Status: DC

## 2014-03-24 NOTE — Telephone Encounter (Signed)
Rx was sent to local and mail in pharmacy.

## 2014-03-24 NOTE — Telephone Encounter (Signed)
Patient requests refill on dicyclomine to be sent to [facility]. Rx sent. Patient advised. She must have colonoscopy or office visit for further refills!

## 2014-03-24 NOTE — Telephone Encounter (Signed)
Rx sent 

## 2014-03-26 ENCOUNTER — Telehealth: Payer: Self-pay | Admitting: Internal Medicine

## 2014-03-26 NOTE — Telephone Encounter (Signed)
Attempted to call x 2. Get busy signal. I will attempt to call at a later time.

## 2014-03-26 NOTE — Telephone Encounter (Signed)
Questions answered.

## 2014-03-30 ENCOUNTER — Other Ambulatory Visit: Payer: Self-pay | Admitting: Internal Medicine

## 2014-04-28 DIAGNOSIS — J45909 Unspecified asthma, uncomplicated: Secondary | ICD-10-CM | POA: Diagnosis not present

## 2014-04-28 DIAGNOSIS — I10 Essential (primary) hypertension: Secondary | ICD-10-CM | POA: Diagnosis not present

## 2014-04-28 DIAGNOSIS — E785 Hyperlipidemia, unspecified: Secondary | ICD-10-CM | POA: Diagnosis not present

## 2014-04-28 DIAGNOSIS — G501 Atypical facial pain: Secondary | ICD-10-CM | POA: Diagnosis not present

## 2014-04-28 DIAGNOSIS — Z0000 Encounter for general adult medical examination without abnormal findings: Secondary | ICD-10-CM | POA: Diagnosis not present

## 2014-04-28 DIAGNOSIS — Z1331 Encounter for screening for depression: Secondary | ICD-10-CM | POA: Diagnosis not present

## 2014-04-28 DIAGNOSIS — K5090 Crohn's disease, unspecified, without complications: Secondary | ICD-10-CM | POA: Diagnosis not present

## 2014-04-28 DIAGNOSIS — E559 Vitamin D deficiency, unspecified: Secondary | ICD-10-CM | POA: Diagnosis not present

## 2014-04-30 DIAGNOSIS — E559 Vitamin D deficiency, unspecified: Secondary | ICD-10-CM | POA: Diagnosis not present

## 2014-04-30 DIAGNOSIS — E785 Hyperlipidemia, unspecified: Secondary | ICD-10-CM | POA: Diagnosis not present

## 2014-04-30 DIAGNOSIS — K1120 Sialoadenitis, unspecified: Secondary | ICD-10-CM | POA: Diagnosis not present

## 2014-05-07 ENCOUNTER — Ambulatory Visit (INDEPENDENT_AMBULATORY_CARE_PROVIDER_SITE_OTHER): Payer: Medicare Other | Admitting: Internal Medicine

## 2014-05-07 ENCOUNTER — Other Ambulatory Visit (INDEPENDENT_AMBULATORY_CARE_PROVIDER_SITE_OTHER): Payer: Medicare Other

## 2014-05-07 ENCOUNTER — Encounter: Payer: Self-pay | Admitting: Internal Medicine

## 2014-05-07 VITALS — BP 116/50 | HR 88 | Ht 63.5 in | Wt 157.2 lb

## 2014-05-07 DIAGNOSIS — D649 Anemia, unspecified: Secondary | ICD-10-CM

## 2014-05-07 DIAGNOSIS — K5090 Crohn's disease, unspecified, without complications: Secondary | ICD-10-CM

## 2014-05-07 LAB — CBC WITH DIFFERENTIAL/PLATELET
BASOS PCT: 0.4 % (ref 0.0–3.0)
Basophils Absolute: 0 10*3/uL (ref 0.0–0.1)
EOS PCT: 2.3 % (ref 0.0–5.0)
Eosinophils Absolute: 0.2 10*3/uL (ref 0.0–0.7)
HEMATOCRIT: 42.5 % (ref 36.0–46.0)
Hemoglobin: 13.9 g/dL (ref 12.0–15.0)
LYMPHS ABS: 1.4 10*3/uL (ref 0.7–4.0)
Lymphocytes Relative: 14.4 % (ref 12.0–46.0)
MCHC: 32.8 g/dL (ref 30.0–36.0)
MCV: 97.2 fl (ref 78.0–100.0)
MONO ABS: 0.7 10*3/uL (ref 0.1–1.0)
Monocytes Relative: 6.9 % (ref 3.0–12.0)
NEUTROS PCT: 76 % (ref 43.0–77.0)
Neutro Abs: 7.4 10*3/uL (ref 1.4–7.7)
PLATELETS: 308 10*3/uL (ref 150.0–400.0)
RBC: 4.37 Mil/uL (ref 3.87–5.11)
RDW: 14.3 % (ref 11.5–15.5)
WBC: 9.8 10*3/uL (ref 4.0–10.5)

## 2014-05-07 MED ORDER — DICYCLOMINE HCL 10 MG PO CAPS: ORAL_CAPSULE | ORAL | Status: DC

## 2014-05-07 NOTE — Patient Instructions (Addendum)
It has been recommended to you by your physician that you have a(n) colonoscopy completed. Per your request, we did not schedule the procedure(s) today. Please contact our office at [phone] should you decide to have the procedure completed.  Your physician has requested that you go to the basement for the following lab work before leaving today: CBC, CMET, IBC, B12, Sed Rate  We have sent the following medications to your pharmacy for you to pick up at your convenience: Dicyclomine  CC:Dr Johnella Moloney

## 2014-05-07 NOTE — Progress Notes (Signed)
Cassandra Garcia 1935/01/14 [id number]  Note: This dictation was prepared with Dragon digital system. Any transcriptional errors that result from this procedure are unintentional.   History of Present Illness:  This is a 79 year old white female with chronic low-grade GI blood loss attributed to inflammatory bowel disease, most likely Crohn's disease of the small bowel. Her anemia has been responsive to iron supplements. A small bowel capsule endoscopy in 2009 showed small bowel ulcerations consistent with inflammatory bowel disease. She denies any abdominal pain or visible blood per rectum. She has lost about 9 pounds in the last year because of stress related to caring for her 31 year old sister. Her last hemoglobin in May 2015 was 13 and her last iron infusion was in February 2013. She had a normal upper endoscopy in 2009 and had severe sigmoid diverticulosis on a colonoscopy in March 2009. A CT scan of the abdomen in March 2009 showed sigmoid diverticulosis. She is due for a repeat colonoscopy.     Past Medical History  Diagnosis Date  . Anemia   . Nephrolithiasis   . Acute asthmatic bronchitis   . Hypertension   . Diverticulosis of colon   . Nausea   . Abdominal pain, left lower quadrant   . Crohn's disease     Past Surgical History  Procedure Laterality Date  . Vaginal hysterectomy    . Cataract extraction Right     Allergies  Allergen Reactions  . Iron Dextran Diarrhea and Nausea And Vomiting    Family history and social history have been reviewed.  Review of Systems: Occasional crampy abdominal pain for which she takes dicyclomine. Weight loss over 9 pounds  The remainder of the 10 point ROS is negative except as outlined in the H&P  Physical Exam: General Appearance Well developed, in no distress Eyes  Non icteric  HEENT  Non traumatic, normocephalic  Mouth No lesion, tongue papillated, no cheilosis Neck Supple without adenopathy, thyroid not enlarged, no carotid  bruits, no JVD Lungs Clear to auscultation bilaterally COR Normal S1, normal S2, regular rhythm, no murmur, quiet precordium Abdomen soft nontender with the normoactive bowel sounds. No distention. Liver edge at costal margin Rectal soft Hemoccult positive stool Extremities  1+ edema pedal edema Skin No lesions Neurological Alert and oriented x 3 Psychological Normal mood and affect  Assessment and Plan:   Problem #77 79 year old white female with the inflammatory bowel disease of the distal small bowel, likely due to Crohn's disease. She has maintained her hemoglobin on chronic iron supplements. We will be rechecking her blood count today and refilling her Bentyl. She continues on Azulfadine 1 g twice a day and folic acid 1 mg daily. Because of the weight loss, we will also check her sed rate, metabolic panel, iron studies and B12. We will schedule a recall colonoscopy as well.    [name] 05/07/2014

## 2014-05-08 ENCOUNTER — Encounter: Payer: Self-pay | Admitting: Internal Medicine

## 2014-05-10 LAB — COMPREHENSIVE METABOLIC PANEL
ALK PHOS: 87 U/L (ref 39–117)
ALT: 18 U/L (ref 0–35)
AST: 26 U/L (ref 0–37)
Albumin: 4.4 g/dL (ref 3.5–5.2)
BILIRUBIN TOTAL: 0.7 mg/dL (ref 0.2–1.2)
BUN: 14 mg/dL (ref 6–23)
CO2: 29 mEq/L (ref 19–32)
CREATININE: 0.6 mg/dL (ref 0.4–1.2)
Calcium: 10.3 mg/dL (ref 8.4–10.5)
Chloride: 101 mEq/L (ref 96–112)
GFR: 113.87 mL/min (ref >60.00)
GLUCOSE: 98 mg/dL (ref 70–99)
Potassium: 4.5 mEq/L (ref 3.5–5.1)
Sodium: 143 mEq/L (ref 135–145)
Total Protein: 6.8 g/dL (ref 6.0–8.3)

## 2014-05-10 LAB — IBC PANEL
IRON: 80 ug/dL (ref 42–145)
Saturation Ratios: 22.2 % (ref 20.0–50.0)
TRANSFERRIN: 257.8 mg/dL (ref 212.0–360.0)

## 2014-05-10 LAB — VITAMIN B12: VITAMIN B 12: 655 pg/mL (ref 211–911)

## 2014-05-10 LAB — SEDIMENTATION RATE: SED RATE: 10 mm/h (ref 0–22)

## 2014-05-11 ENCOUNTER — Other Ambulatory Visit: Payer: Self-pay | Admitting: *Deleted

## 2014-05-11 DIAGNOSIS — K50919 Crohn's disease, unspecified, with unspecified complications: Secondary | ICD-10-CM

## 2014-05-18 DIAGNOSIS — E559 Vitamin D deficiency, unspecified: Secondary | ICD-10-CM | POA: Diagnosis not present

## 2014-05-18 DIAGNOSIS — K1120 Sialoadenitis, unspecified: Secondary | ICD-10-CM | POA: Diagnosis not present

## 2014-05-18 DIAGNOSIS — Z23 Encounter for immunization: Secondary | ICD-10-CM | POA: Diagnosis not present

## 2014-05-18 DIAGNOSIS — I10 Essential (primary) hypertension: Secondary | ICD-10-CM | POA: Diagnosis not present

## 2014-05-28 ENCOUNTER — Other Ambulatory Visit: Payer: Self-pay | Admitting: Internal Medicine

## 2014-06-18 ENCOUNTER — Other Ambulatory Visit: Payer: Self-pay | Admitting: Internal Medicine

## 2014-06-18 ENCOUNTER — Encounter: Payer: Self-pay | Admitting: Internal Medicine

## 2014-07-30 ENCOUNTER — Other Ambulatory Visit: Payer: Self-pay | Admitting: Internal Medicine

## 2014-08-02 ENCOUNTER — Telehealth: Payer: Self-pay | Admitting: Internal Medicine

## 2014-08-02 NOTE — Telephone Encounter (Signed)
Patient advised that I sent rx to [facility] on 07-30-14 for 90 day supply of dicyclomine. She verbalizes understanding.

## 2014-08-09 ENCOUNTER — Telehealth: Payer: Self-pay | Admitting: *Deleted

## 2014-08-09 NOTE — Telephone Encounter (Signed)
Spoke with patient and she will come for labs. She is having problems with her furnace and is going to have to go to [place] for several weeks. She will reschedule for procedure in January. Cancelled appointments per patient.

## 2014-08-09 NOTE — Telephone Encounter (Signed)
-----   Message from Daphine Deutscher, RN sent at 02/04/2014 10:40 AM EDT ----- Call and remind patient due for CBC for DB on 08/09/14

## 2014-08-17 ENCOUNTER — Other Ambulatory Visit (INDEPENDENT_AMBULATORY_CARE_PROVIDER_SITE_OTHER): Payer: Medicare Other

## 2014-08-17 DIAGNOSIS — K50919 Crohn's disease, unspecified, with unspecified complications: Secondary | ICD-10-CM

## 2014-08-17 LAB — CBC WITH DIFFERENTIAL/PLATELET
BASOS PCT: 0.7 % (ref 0.0–3.0)
Basophils Absolute: 0 10*3/uL (ref 0.0–0.1)
Eosinophils Absolute: 0.3 10*3/uL (ref 0.0–0.7)
Eosinophils Relative: 5.1 % — ABNORMAL HIGH (ref 0.0–5.0)
HCT: 40.7 % (ref 36.0–46.0)
Hemoglobin: 13.3 g/dL (ref 12.0–15.0)
LYMPHS PCT: 20.2 % (ref 12.0–46.0)
Lymphs Abs: 1.1 10*3/uL (ref 0.7–4.0)
MCHC: 32.7 g/dL (ref 30.0–36.0)
MCV: 97.2 fl (ref 78.0–100.0)
Monocytes Absolute: 0.5 10*3/uL (ref 0.1–1.0)
Monocytes Relative: 9.1 % (ref 3.0–12.0)
NEUTROS ABS: 3.4 10*3/uL (ref 1.4–7.7)
Neutrophils Relative %: 64.9 % (ref 43.0–77.0)
Platelets: 264 10*3/uL (ref 150.0–400.0)
RBC: 4.19 Mil/uL (ref 3.87–5.11)
RDW: 13.6 % (ref 11.5–15.5)
WBC: 5.2 10*3/uL (ref 4.0–10.5)

## 2014-08-17 LAB — IBC PANEL
Iron: 74 ug/dL (ref 42–145)
SATURATION RATIOS: 22.5 % (ref 20.0–50.0)
Transferrin: 235.3 mg/dL (ref 212.0–360.0)

## 2014-08-18 ENCOUNTER — Other Ambulatory Visit: Payer: Self-pay | Admitting: *Deleted

## 2014-08-18 DIAGNOSIS — D509 Iron deficiency anemia, unspecified: Secondary | ICD-10-CM

## 2014-08-25 ENCOUNTER — Encounter: Payer: Medicare Other | Admitting: Internal Medicine

## 2014-11-04 ENCOUNTER — Telehealth: Payer: Self-pay | Admitting: *Deleted

## 2014-11-04 NOTE — Telephone Encounter (Signed)
Line busy - will try again later

## 2014-11-04 NOTE — Telephone Encounter (Signed)
-----   Message from Daphine Deutscher, RN sent at 05/11/2014  8:49 AM EDT ----- Call and remind patient due for labs for DB on 11/08/14.

## 2014-11-05 ENCOUNTER — Telehealth: Payer: Self-pay | Admitting: Internal Medicine

## 2014-11-05 MED ORDER — DICYCLOMINE HCL 10 MG PO CAPS: ORAL_CAPSULE | ORAL | Status: DC

## 2014-11-05 NOTE — Telephone Encounter (Signed)
Rx sent for Bentyl, 10 mg, #180, No refills to [facility].

## 2014-11-08 NOTE — Telephone Encounter (Signed)
Left a message for patient to call back. 

## 2014-11-09 ENCOUNTER — Encounter: Payer: Self-pay | Admitting: *Deleted

## 2014-11-09 NOTE — Telephone Encounter (Signed)
Letter mailed to patient re: labs that are due.

## 2014-11-15 ENCOUNTER — Other Ambulatory Visit (INDEPENDENT_AMBULATORY_CARE_PROVIDER_SITE_OTHER): Payer: Medicare Other

## 2014-11-15 DIAGNOSIS — D509 Iron deficiency anemia, unspecified: Secondary | ICD-10-CM | POA: Diagnosis not present

## 2014-11-15 LAB — CBC WITH DIFFERENTIAL/PLATELET
Basophils Absolute: 0 10*3/uL (ref 0.0–0.1)
Basophils Relative: 0.4 % (ref 0.0–3.0)
EOS ABS: 0.2 10*3/uL (ref 0.0–0.7)
Eosinophils Relative: 2.4 % (ref 0.0–5.0)
HCT: 42.8 % (ref 36.0–46.0)
Hemoglobin: 14.2 g/dL (ref 12.0–15.0)
Lymphocytes Relative: 15 % (ref 12.0–46.0)
Lymphs Abs: 1.2 10*3/uL (ref 0.7–4.0)
MCHC: 33.3 g/dL (ref 30.0–36.0)
MCV: 97.6 fl (ref 78.0–100.0)
MONOS PCT: 6.8 % (ref 3.0–12.0)
Monocytes Absolute: 0.5 10*3/uL (ref 0.1–1.0)
NEUTROS PCT: 75.4 % (ref 43.0–77.0)
Neutro Abs: 6.1 10*3/uL (ref 1.4–7.7)
Platelets: 266 10*3/uL (ref 150.0–400.0)
RBC: 4.38 Mil/uL (ref 3.87–5.11)
RDW: 14 % (ref 11.5–15.5)
WBC: 8 10*3/uL (ref 4.0–10.5)

## 2014-11-15 LAB — IBC PANEL
Iron: 263 ug/dL — ABNORMAL HIGH (ref 42–145)
Saturation Ratios: 75.1 % — ABNORMAL HIGH (ref 20.0–50.0)
Transferrin: 250 mg/dL (ref 212.0–360.0)

## 2014-11-16 ENCOUNTER — Other Ambulatory Visit: Payer: Self-pay | Admitting: *Deleted

## 2014-11-16 DIAGNOSIS — K50119 Crohn's disease of large intestine with unspecified complications: Secondary | ICD-10-CM

## 2014-12-12 ENCOUNTER — Other Ambulatory Visit (HOSPITAL_COMMUNITY): Payer: Self-pay

## 2014-12-12 ENCOUNTER — Emergency Department (HOSPITAL_COMMUNITY): Payer: Medicare Other

## 2014-12-12 ENCOUNTER — Inpatient Hospital Stay (HOSPITAL_COMMUNITY)
Admission: EM | Admit: 2014-12-12 | Discharge: 2014-12-15 | DRG: 872 | Disposition: A | Discharge status: Home / Self Care | Payer: Medicare Other | Attending: Internal Medicine | Admitting: Internal Medicine

## 2014-12-12 ENCOUNTER — Encounter (HOSPITAL_COMMUNITY): Payer: Self-pay | Admitting: Emergency Medicine

## 2014-12-12 DIAGNOSIS — Z87442 Personal history of urinary calculi: Secondary | ICD-10-CM | POA: Diagnosis not present

## 2014-12-12 DIAGNOSIS — J4520 Mild intermittent asthma, uncomplicated: Secondary | ICD-10-CM | POA: Diagnosis not present

## 2014-12-12 DIAGNOSIS — E876 Hypokalemia: Secondary | ICD-10-CM | POA: Diagnosis not present

## 2014-12-12 DIAGNOSIS — R509 Fever, unspecified: Secondary | ICD-10-CM | POA: Diagnosis not present

## 2014-12-12 DIAGNOSIS — K5090 Crohn's disease, unspecified, without complications: Secondary | ICD-10-CM | POA: Diagnosis not present

## 2014-12-12 DIAGNOSIS — D638 Anemia in other chronic diseases classified elsewhere: Secondary | ICD-10-CM | POA: Diagnosis present

## 2014-12-12 DIAGNOSIS — I10 Essential (primary) hypertension: Secondary | ICD-10-CM | POA: Diagnosis not present

## 2014-12-12 DIAGNOSIS — E860 Dehydration: Secondary | ICD-10-CM | POA: Diagnosis present

## 2014-12-12 DIAGNOSIS — Z800 Family history of malignant neoplasm of digestive organs: Secondary | ICD-10-CM | POA: Diagnosis not present

## 2014-12-12 DIAGNOSIS — K50918 Crohn's disease, unspecified, with other complication: Secondary | ICD-10-CM

## 2014-12-12 DIAGNOSIS — A419 Sepsis, unspecified organism: Secondary | ICD-10-CM | POA: Diagnosis present

## 2014-12-12 DIAGNOSIS — I959 Hypotension, unspecified: Secondary | ICD-10-CM

## 2014-12-12 DIAGNOSIS — Z888 Allergy status to other drugs, medicaments and biological substances status: Secondary | ICD-10-CM

## 2014-12-12 DIAGNOSIS — A084 Viral intestinal infection, unspecified: Secondary | ICD-10-CM | POA: Diagnosis present

## 2014-12-12 DIAGNOSIS — J45909 Unspecified asthma, uncomplicated: Secondary | ICD-10-CM | POA: Diagnosis present

## 2014-12-12 DIAGNOSIS — A4189 Other specified sepsis: Secondary | ICD-10-CM | POA: Insufficient documentation

## 2014-12-12 DIAGNOSIS — I9589 Other hypotension: Secondary | ICD-10-CM | POA: Diagnosis not present

## 2014-12-12 DIAGNOSIS — R1110 Vomiting, unspecified: Secondary | ICD-10-CM

## 2014-12-12 DIAGNOSIS — Z806 Family history of leukemia: Secondary | ICD-10-CM

## 2014-12-12 DIAGNOSIS — Z7982 Long term (current) use of aspirin: Secondary | ICD-10-CM | POA: Diagnosis not present

## 2014-12-12 DIAGNOSIS — E871 Hypo-osmolality and hyponatremia: Secondary | ICD-10-CM | POA: Diagnosis present

## 2014-12-12 DIAGNOSIS — K5730 Diverticulosis of large intestine without perforation or abscess without bleeding: Secondary | ICD-10-CM | POA: Diagnosis present

## 2014-12-12 DIAGNOSIS — Z79899 Other long term (current) drug therapy: Secondary | ICD-10-CM

## 2014-12-12 DIAGNOSIS — R112 Nausea with vomiting, unspecified: Secondary | ICD-10-CM

## 2014-12-12 LAB — CBC WITH DIFFERENTIAL/PLATELET
BASOS PCT: 0 % (ref 0–1)
Basophils Absolute: 0 10*3/uL (ref 0.0–0.1)
Eosinophils Absolute: 0 10*3/uL (ref 0.0–0.7)
Eosinophils Relative: 0 % (ref 0–5)
HCT: 45.4 % (ref 36.0–46.0)
HEMOGLOBIN: 15 g/dL (ref 12.0–15.0)
Lymphocytes Relative: 3 % — ABNORMAL LOW (ref 12–46)
Lymphs Abs: 0.4 10*3/uL — ABNORMAL LOW (ref 0.7–4.0)
MCH: 32.7 pg (ref 26.0–34.0)
MCHC: 33 g/dL (ref 30.0–36.0)
MCV: 98.9 fL (ref 78.0–100.0)
MONOS PCT: 4 % (ref 3–12)
Monocytes Absolute: 0.5 10*3/uL (ref 0.1–1.0)
NEUTROS ABS: 12.7 10*3/uL — AB (ref 1.7–7.7)
NEUTROS PCT: 93 % — AB (ref 43–77)
PLATELETS: 211 10*3/uL (ref 150–400)
RBC: 4.59 MIL/uL (ref 3.87–5.11)
RDW: 12.6 % (ref 11.5–15.5)
WBC: 13.7 10*3/uL — ABNORMAL HIGH (ref 4.0–10.5)

## 2014-12-12 LAB — URINALYSIS, ROUTINE W REFLEX MICROSCOPIC
GLUCOSE, UA: NEGATIVE mg/dL
Hgb urine dipstick: NEGATIVE
Ketones, ur: 15 mg/dL — AB
LEUKOCYTES UA: NEGATIVE
Nitrite: NEGATIVE
PROTEIN: NEGATIVE mg/dL
SPECIFIC GRAVITY, URINE: 1.03 (ref 1.005–1.030)
Urobilinogen, UA: 0.2 mg/dL (ref 0.0–1.0)
pH: 5.5 (ref 5.0–8.0)

## 2014-12-12 LAB — COMPREHENSIVE METABOLIC PANEL
ALBUMIN: 4 g/dL (ref 3.5–5.2)
ALT: 14 U/L (ref 0–35)
AST: 21 U/L (ref 0–37)
Alkaline Phosphatase: 73 U/L (ref 39–117)
Anion gap: 11 (ref 5–15)
BUN: 23 mg/dL (ref 6–23)
CHLORIDE: 107 mmol/L (ref 96–112)
CO2: 22 mmol/L (ref 19–32)
CREATININE: 0.56 mg/dL (ref 0.50–1.10)
Calcium: 8.7 mg/dL (ref 8.4–10.5)
GFR calc non Af Amer: 88 mL/min — ABNORMAL LOW (ref >90)
Glucose, Bld: 124 mg/dL — ABNORMAL HIGH (ref 70–99)
Potassium: 3.4 mmol/L — ABNORMAL LOW (ref 3.5–5.1)
SODIUM: 140 mmol/L (ref 135–145)
Total Bilirubin: 0.7 mg/dL (ref 0.3–1.2)
Total Protein: 5.8 g/dL — ABNORMAL LOW (ref 6.0–8.3)

## 2014-12-12 LAB — I-STAT CG4 LACTIC ACID, ED: Lactic Acid, Venous: 1.6 mmol/L (ref 0.5–2.0)

## 2014-12-12 LAB — LIPASE, BLOOD: Lipase: 27 U/L (ref 11–59)

## 2014-12-12 MED ORDER — SODIUM CHLORIDE 0.9 % IV BOLUS (SEPSIS)
1000.0000 mL | Freq: Once | INTRAVENOUS | Status: AC
  Administered 2014-12-12: 1000 mL via INTRAVENOUS

## 2014-12-12 MED ORDER — ACETAMINOPHEN 325 MG PO TABS
650.0000 mg | ORAL_TABLET | Freq: Once | ORAL | Status: AC
  Administered 2014-12-12: 650 mg via ORAL
  Filled 2014-12-12: qty 2

## 2014-12-12 MED ORDER — ONDANSETRON HCL 4 MG/2ML IJ SOLN
4.0000 mg | Freq: Once | INTRAMUSCULAR | Status: AC
  Administered 2014-12-12: 4 mg via INTRAVENOUS
  Filled 2014-12-12: qty 2

## 2014-12-12 MED ORDER — SODIUM CHLORIDE 0.9 % IV SOLN: INTRAVENOUS | Status: DC

## 2014-12-12 MED ORDER — ONDANSETRON HCL 4 MG/2ML IJ SOLN: 4.0000 mg | Freq: Three times a day (TID) | INTRAMUSCULAR | Status: DC | PRN

## 2014-12-12 MED ORDER — CEFTRIAXONE SODIUM 1 G IJ SOLR
1.0000 g | Freq: Once | INTRAMUSCULAR | Status: AC
  Administered 2014-12-12: 1 g via INTRAVENOUS
  Filled 2014-12-12: qty 10

## 2014-12-12 NOTE — ED Notes (Signed)
Admitting MD done with patient MD made aware that flu swabs are not kept in the ED MD stated that testing can be completed on floor Will transport patient to floor

## 2014-12-12 NOTE — ED Notes (Signed)
Patient made aware of need for urine specimen Call bell in reach  Patient and pt's visitor deny any needs at this time

## 2014-12-12 NOTE — ED Notes (Signed)
Pt from home c/o vomiting which started at 1700 and fever of 101. She took one tylenol and dramamine. PT denies pain. Pt tachycardic and hypotensive in triage.

## 2014-12-12 NOTE — ED Notes (Signed)
Below orders not completed by EW. 

## 2014-12-12 NOTE — ED Notes (Addendum)
Patient moved off of the floor While attempting to transport patient up to floor, hospitalist in to speak with patient ED does not carry flu swabs and must be obtained from the lab Will call the lab and have specimen swabs sent to ED IF patient is still present in ED when swabs arrive, testing will be completed Huntley Dec, Nursing Supervisor present in ED and is aware of delay in transfer to floor and that flu swabs are not kept in ED

## 2014-12-12 NOTE — ED Notes (Addendum)
Patient with c/o N/V and fevers since 0930 Patient denies diarrhea Patient states that her temp at home this AM was 101.0 Patient states that she took Tylenol 325 mg and Dramamine at 0930 Patient in NAD No active emesis

## 2014-12-12 NOTE — H&P (Signed)
[facility] History and Physical  Cassandra Garcia ZOX:[id number] DOB: Oct 27, 1934 DOA: 12/12/2014  Referring physician:  PCP: Pearla Dubonnet, MD  Specialists:   Chief Complaint: Sepsis  HPI: Cassandra Garcia is a 80 y.o. WF PMHx asthma (has not required rescue inhaler in 4 years) essential HTN, Crohn's disease diverticulosis of colon, anemia chronic disease. follows with Dr. [name] (gastroenterology)  Presents with nausea and vomiting, negative diarrhea and fever since 5:00 this morning. No sick contacts no recent travel. No focal abdominal pain. Decreased oral intake, general fatigue. No other symptoms. No current antibiotics. Patient had initial improvement and then symptoms returned. Negative blood in stool, negative sick contacts. States continued nausea, continued fatigue. States lives alone   Review of Systems: The patient denies anorexia, weight loss,, vision loss, decreased hearing, hoarseness, chest pain, syncope, , peripheral edema, balance deficits, hemoptysis,  melena, hematochezia, severe indigestion/heartburn, hematuria, incontinence, genital sores, suspicious skin lesions, transient blindness,depression, unusual weight change, abnormal bleeding, enlarged lymph nodes, angioedema, and breast masses.    TRAVEL HISTORY: NA   Consultants:  NA  Procedure/Significant Events:  4/3 CXR;Mild right basilar scarring.-Small -moderate hiatal hernia.    Culture  4/3 blood pending 4/3 Influenza panel pending 4/3 respiratory virus panel pending 4/3 urine pending    Antibiotics:  Ceftriaxone 4/3>> stopped 4/4 Zosyn 4/4>> Vancomycin 4/4>>   DVT prophylaxis:  Lovenox  Devices     LINES / TUBES:     Past Medical History  Diagnosis Date  . Anemia   . Nephrolithiasis   . Acute asthmatic bronchitis   . Hypertension   . Diverticulosis of colon   . Nausea   . Abdominal pain, left lower quadrant   . Crohn's disease    Past Surgical History  Procedure  Laterality Date  . Vaginal hysterectomy    . Cataract extraction Right    Social History:  reports that she has never smoked. She has never used smokeless tobacco. She reports that she does not drink alcohol or use illicit drugs.     Allergies  Allergen Reactions  . Iron Dextran Diarrhea and Nausea And Vomiting    Family History  Problem Relation Age of Onset  . Esophageal cancer Father   . Liver cancer Brother   . Leukemia Brother   . Colon cancer Neg Hx   . Rectal cancer Neg Hx   . Stomach cancer Neg Hx       Prior to Admission medications   Medication Sig Start Date End Date Taking? Authorizing Provider  acetaminophen (TYLENOL) 500 MG tablet Take 500 mg by mouth as needed for mild pain or headache.    Yes Historical Provider, MD  albuterol (PROVENTIL HFA;VENTOLIN HFA) 108 (90 BASE) MCG/ACT inhaler Inhale 1 puff into the lungs every 6 (six) hours as needed. asthma   Yes Historical Provider, MD  aspirin 81 MG tablet Take 81 mg by mouth daily.     Yes Historical Provider, MD  dicyclomine (BENTYL) 10 MG capsule TAKE 1 CAPSULE TWICE DAILY 11/05/14  Yes Hart Carwin, MD  folic acid (FOLVITE) 1 MG tablet TAKE 1 TABLET EVERY DAY (MUST KEEP COLONOSCOPY 08/2014 FOR FURTHER REFILLS) 07/30/14  Yes Hart Carwin, MD  furosemide (LASIX) 20 MG tablet Take 20 mg by mouth daily.     Yes Historical Provider, MD  lisinopril (PRINIVIL,ZESTRIL) 10 MG tablet Take 10 mg by mouth daily.     Yes Historical Provider, MD  simvastatin (ZOCOR) 20 MG tablet Take 20 mg by mouth at  bedtime.     Yes Historical Provider, MD  sulfaSALAzine (AZULFIDINE) 500 MG tablet TAKE 2 TABLETS TWICE DAILY (MUST HAVE OFFICE VISIT FOR FURTHER REFILLS) 07/30/14  Yes Hart Carwin, MD   Physical Exam: Filed Vitals:   12/12/14 2145 12/12/14 2230 12/12/14 2251 12/12/14 2300  BP: 103/81 109/48  116/53  Pulse: 99 100  95  Temp:   99.2 F (37.3 C)   TempSrc:   Oral   Resp: 13 14  13   Weight:      SpO2: 96% 96%  96%      General:  A/O 4, NAD  Eyes: Pupils equal reactive to light and accommodation  Neck: Negative JVD, negative lymphadenopathy  Cardiovascular: Tachycardic, regular rhythm, negative murmurs rubs gallops, normal S1/S2  Respiratory: Clear to auscultation bilateral  Abdomen: Soft, nontender, nondistended, plus bowel sound  Musculoskeletal: Negative pedal edema  Labs on Admission:  Basic Metabolic Panel:  Recent Labs Lab 12/12/14 1913  NA 140  K 3.4*  CL 107  CO2 22  GLUCOSE 124*  BUN 23  CREATININE 0.56  CALCIUM 8.7   Liver Function Tests:  Recent Labs Lab 12/12/14 1913  AST 21  ALT 14  ALKPHOS 73  BILITOT 0.7  PROT 5.8*  ALBUMIN 4.0    Recent Labs Lab 12/12/14 1913  LIPASE 27   No results for input(s): AMMONIA in the last 168 hours. CBC:  Recent Labs Lab 12/12/14 1913  WBC 13.7*  NEUTROABS 12.7*  HGB 15.0  HCT 45.4  MCV 98.9  PLT 211   Cardiac Enzymes: No results for input(s): CKTOTAL, CKMB, CKMBINDEX, TROPONINI in the last 168 hours.  BNP (last 3 results) No results for input(s): BNP in the last 8760 hours.  ProBNP (last 3 results) No results for input(s): PROBNP in the last 8760 hours.  CBG: No results for input(s): GLUCAP in the last 168 hours.  Radiological Exams on Admission: Dg Chest 2 View  12/12/2014   CLINICAL DATA:  Fever. Nausea and vomiting. Bronchitis. Asthma. Hypertension.  EXAM: CHEST  2 VIEW  COMPARISON:  07/26/2011  FINDINGS: Hiatal hernia.  Thoracic spondylosis.  Heart size within normal limits. Mild scarring or subsegmental atelectasis in the right lower lobe.  No pleural effusion or additional airspace opacities.  IMPRESSION: 1. Mild right basilar scarring. 2. Small to moderate hiatal hernia. 3. Thoracic spondylosis.   Electronically Signed   By: Gaylyn Rong M.D.   On: 12/12/2014 19:24    EKG: Sinus tachycardia  Assessment/Plan Principal Problem:   Sepsis Active Problems:   Diverticulosis of colon    Essential hypertension   Crohn's disease   Anemia of chronic disease   Asthma, chronic  Sepsis due to other etiology - Unknown origin treatment empirically; blood/urine culture pending -Influenza panel/respiratory virus panel pending -Narrow antibiotics based on findings  Essential hypertension -Repeat EKG in a.m. -Hold home furosemide and lisinopril secondary to hypotension -Continue normal saline 185ml/hr  Asthma chronic -Patient has not had requirement for rescue inhaler in 4 years -When necessary albuterol  Crohn's disease -Continue sulfasalazine 1000 mg BID -Occult blood Diverticulosis of colon  Hypokalemia -K-Dur 20 mEq 3 doses -Recheck CMP, magnesium in the a.m.   Code Status: Full (must indicate code status--if unknown or must be presumed, indicate so) Family Communication: None Disposition Plan: Resolution sepsis  Time spent: 60 minutes  WOODS, Roselind Messier [facility] Pager [phone]  If 7PM-7AM, please contact night-coverage [email or web address] Password [email or web address] 12/12/2014, 11:59 PM

## 2014-12-12 NOTE — ED Provider Notes (Signed)
CSN: [id number]     Arrival date & time 12/12/14  1844 History   First MD Initiated Contact with Patient 12/12/14 1921     Chief Complaint  Patient presents with  . Emesis  . Fever     (Consider location/radiation/quality/duration/timing/severity/associated sxs/prior Treatment) HPI Comments: 80 year old female with thigh blood pressure, anemia, colitis follows with gastroenterology presents with vomiting diarrhea and fever since 5:00 this morning. No sick contacts no recent travel. No focal abdominal pain. Decreased oral intake, general fatigue. No other symptoms. No current antibiotics. Patient had initial improvement and then symptoms returned.  Patient is a 80 y.o. female presenting with vomiting and fever. The history is provided by the patient.  Emesis Associated symptoms: diarrhea   Associated symptoms: no abdominal pain, no chills and no headaches   Fever Associated symptoms: diarrhea, nausea and vomiting   Associated symptoms: no chest pain, no chills, no congestion, no dysuria, no headaches and no rash     Past Medical History  Diagnosis Date  . Anemia   . Nephrolithiasis   . Acute asthmatic bronchitis   . Hypertension   . Diverticulosis of colon   . Nausea   . Abdominal pain, left lower quadrant   . Crohn's disease    Past Surgical History  Procedure Laterality Date  . Vaginal hysterectomy    . Cataract extraction Right    Family History  Problem Relation Age of Onset  . Esophageal cancer Father   . Liver cancer Brother   . Leukemia Brother   . Colon cancer Neg Hx   . Rectal cancer Neg Hx   . Stomach cancer Neg Hx    History  Substance Use Topics  . Smoking status: Never Smoker   . Smokeless tobacco: Never Used  . Alcohol Use: No   OB History    No data available     Review of Systems  Constitutional: Positive for fever and appetite change. Negative for chills.  HENT: Negative for congestion.   Eyes: Negative for visual disturbance.  Respiratory:  Negative for shortness of breath.   Cardiovascular: Negative for chest pain.  Gastrointestinal: Positive for nausea, vomiting and diarrhea. Negative for abdominal pain.  Genitourinary: Negative for dysuria and flank pain.  Musculoskeletal: Negative for back pain, neck pain and neck stiffness.  Skin: Negative for rash.  Neurological: Negative for light-headedness and headaches.      Allergies  Iron dextran  Home Medications   Prior to Admission medications   Medication Sig Start Date End Date Taking? Authorizing Provider  acetaminophen (TYLENOL) 500 MG tablet Take 500 mg by mouth as needed for mild pain or headache.    Yes Historical Provider, MD  albuterol (PROVENTIL HFA;VENTOLIN HFA) 108 (90 BASE) MCG/ACT inhaler Inhale 1 puff into the lungs every 6 (six) hours as needed. asthma   Yes Historical Provider, MD  aspirin 81 MG tablet Take 81 mg by mouth daily.     Yes Historical Provider, MD  dicyclomine (BENTYL) 10 MG capsule TAKE 1 CAPSULE TWICE DAILY 11/05/14  Yes Hart Carwin, MD  folic acid (FOLVITE) 1 MG tablet TAKE 1 TABLET EVERY DAY (MUST KEEP COLONOSCOPY 08/2014 FOR FURTHER REFILLS) 07/30/14  Yes Hart Carwin, MD  furosemide (LASIX) 20 MG tablet Take 20 mg by mouth daily.     Yes Historical Provider, MD  lisinopril (PRINIVIL,ZESTRIL) 10 MG tablet Take 10 mg by mouth daily.     Yes Historical Provider, MD  simvastatin (ZOCOR) 20 MG tablet Take 20  mg by mouth at bedtime.     Yes Historical Provider, MD  sulfaSALAzine (AZULFIDINE) 500 MG tablet TAKE 2 TABLETS TWICE DAILY (MUST HAVE OFFICE VISIT FOR FURTHER REFILLS) 07/30/14  Yes Hart Carwin, MD   BP 116/53 mmHg  Pulse 95  Temp(Src) 99.2 F (37.3 C) (Oral)  Resp 13  Wt 156 lb (70.761 kg)  SpO2 96% Physical Exam  Constitutional: She is oriented to person, place, and time. She appears well-developed and well-nourished.  HENT:  Head: Normocephalic and atraumatic.  Dry mm  Eyes: Conjunctivae are normal. Right eye exhibits no  discharge. Left eye exhibits no discharge.  Neck: Normal range of motion. Neck supple. No tracheal deviation present.  Cardiovascular: Normal rate and regular rhythm.   Pulmonary/Chest: Effort normal and breath sounds normal.  Abdominal: Soft. She exhibits no distension. There is no tenderness. There is no guarding.  Musculoskeletal: She exhibits no edema.  Neurological: She is alert and oriented to person, place, and time.  Skin: Skin is warm. No rash noted.  Psychiatric: She has a normal mood and affect.  Nursing note and vitals reviewed.   ED Course  Procedures (including critical care time) CRITICAL CARE Performed by: Enid Skeens   Total critical care time: 35 min  Critical care time was exclusive of separately billable procedures and treating other patients.  Critical care was necessary to treat or prevent imminent or life-threatening deterioration.  Critical care was time spent personally by me on the following activities: development of treatment plan with patient and/or surrogate as well as nursing, discussions with consultants, evaluation of patient's response to treatment, examination of patient, obtaining history from patient or surrogate, ordering and performing treatments and interventions, ordering and review of laboratory studies, ordering and review of radiographic studies, pulse oximetry and re-evaluation of patient's condition.  Labs Review Labs Reviewed  CBC WITH DIFFERENTIAL/PLATELET - Abnormal; Notable for the following:    WBC 13.7 (*)    Neutrophils Relative % 93 (*)    Neutro Abs 12.7 (*)    Lymphocytes Relative 3 (*)    Lymphs Abs 0.4 (*)    All other components within normal limits  COMPREHENSIVE METABOLIC PANEL - Abnormal; Notable for the following:    Potassium 3.4 (*)    Glucose, Bld 124 (*)    Total Protein 5.8 (*)    GFR calc non Af Amer 88 (*)    All other components within normal limits  URINALYSIS, ROUTINE W REFLEX MICROSCOPIC -  Abnormal; Notable for the following:    Bilirubin Urine SMALL (*)    Ketones, ur 15 (*)    All other components within normal limits  CULTURE, BLOOD (ROUTINE X 2)  CULTURE, BLOOD (ROUTINE X 2)  RESPIRATORY VIRUS PANEL  LIPASE, BLOOD  INFLUENZA PANEL BY PCR (TYPE A & B, H1N1)  I-STAT CG4 LACTIC ACID, ED    Imaging Review Dg Chest 2 View  12/12/2014   CLINICAL DATA:  Fever. Nausea and vomiting. Bronchitis. Asthma. Hypertension.  EXAM: CHEST  2 VIEW  COMPARISON:  07/26/2011  FINDINGS: Hiatal hernia.  Thoracic spondylosis.  Heart size within normal limits. Mild scarring or subsegmental atelectasis in the right lower lobe.  No pleural effusion or additional airspace opacities.  IMPRESSION: 1. Mild right basilar scarring. 2. Small to moderate hiatal hernia. 3. Thoracic spondylosis.   Electronically Signed   By: Gaylyn Rong M.D.   On: 12/12/2014 19:24     EKG Interpretation None      MDM  Final diagnoses:  Sepsis, due to unspecified organism  Non-intractable vomiting with nausea, vomiting of unspecified type  Transient hypotension  Hyponatremia   Patient's blood pressure 80s on arrival. Patient's blood pressure improved with multiple fluid boluses. Patient presented with concerns for sepsis, clinically gastritis with no focal abdominal pain. Patient did improve in the ER with multiple fluid boluses however on reassessment blood pressure low 100 and patient still feels generally unwell. Urinalysis pending, Rocephin ordered for possible urine infection with delay in urine.  Antibiotics not given a front due to normal lactate and clinically concern for viral gastritis however with patient on improving antibiotics added. Chest x-ray reviewed no acute findings. The patients results and plan were reviewed and discussed.   Any x-rays performed were personally reviewed by myself.   Differential diagnosis were considered with the presenting HPI.  Medications  0.9 %  sodium chloride  infusion (not administered)  ondansetron (ZOFRAN) injection 4 mg (not administered)  sodium chloride 0.9 % bolus 1,000 mL (0 mLs Intravenous Stopped 12/12/14 2035)  sodium chloride 0.9 % bolus 1,000 mL (0 mLs Intravenous Stopped 12/12/14 2215)  ondansetron (ZOFRAN) injection 4 mg (4 mg Intravenous Given 12/12/14 1955)  sodium chloride 0.9 % bolus 1,000 mL (1,000 mLs Intravenous New Bag/Given 12/12/14 2241)  cefTRIAXone (ROCEPHIN) 1 g in dextrose 5 % 50 mL IVPB (0 g Intravenous Stopped 12/12/14 2331)  acetaminophen (TYLENOL) tablet 650 mg (650 mg Oral Given 12/12/14 2251)    Filed Vitals:   12/12/14 2145 12/12/14 2230 12/12/14 2251 12/12/14 2300  BP: 103/81 109/48  116/53  Pulse: 99 100  95  Temp:   99.2 F (37.3 C)   TempSrc:   Oral   Resp: 13 14  13   Weight:      SpO2: 96% 96%  96%    Final diagnoses:  Sepsis, due to unspecified organism  Non-intractable vomiting with nausea, vomiting of unspecified type  Transient hypotension  Hyponatremia    Admission/ observation were discussed with the admitting physician, patient and/or family and they are comfortable with the plan.      Blane Ohara, MD 12/12/14 2350

## 2014-12-12 NOTE — ED Notes (Signed)
Patient in CXR

## 2014-12-13 ENCOUNTER — Encounter (HOSPITAL_COMMUNITY): Payer: Self-pay | Admitting: *Deleted

## 2014-12-13 DIAGNOSIS — A4189 Other specified sepsis: Secondary | ICD-10-CM | POA: Insufficient documentation

## 2014-12-13 DIAGNOSIS — E876 Hypokalemia: Secondary | ICD-10-CM | POA: Insufficient documentation

## 2014-12-13 DIAGNOSIS — D638 Anemia in other chronic diseases classified elsewhere: Secondary | ICD-10-CM

## 2014-12-13 DIAGNOSIS — A419 Sepsis, unspecified organism: Secondary | ICD-10-CM

## 2014-12-13 DIAGNOSIS — I10 Essential (primary) hypertension: Secondary | ICD-10-CM

## 2014-12-13 DIAGNOSIS — K5090 Crohn's disease, unspecified, without complications: Secondary | ICD-10-CM

## 2014-12-13 LAB — PROTIME-INR
INR: 1.2 (ref 0.00–1.49)
Prothrombin Time: 15.3 seconds — ABNORMAL HIGH (ref 11.6–15.2)

## 2014-12-13 LAB — FIBRINOGEN: FIBRINOGEN: 347 mg/dL (ref 204–475)

## 2014-12-13 LAB — INFLUENZA PANEL BY PCR (TYPE A & B)
H1N1 flu by pcr: NOT DETECTED
Influenza A By PCR: NEGATIVE
Influenza B By PCR: NEGATIVE

## 2014-12-13 LAB — COMPREHENSIVE METABOLIC PANEL
ALBUMIN: 2.9 g/dL — AB (ref 3.5–5.2)
ALT: 12 U/L (ref 0–35)
ANION GAP: 6 (ref 5–15)
AST: 19 U/L (ref 0–37)
Alkaline Phosphatase: 52 U/L (ref 39–117)
BUN: 12 mg/dL (ref 6–23)
CO2: 25 mmol/L (ref 19–32)
Calcium: 7.5 mg/dL — ABNORMAL LOW (ref 8.4–10.5)
Chloride: 108 mmol/L (ref 96–112)
Creatinine, Ser: 0.53 mg/dL (ref 0.50–1.10)
GFR calc Af Amer: >90 mL/min (ref >90)
GFR calc non Af Amer: 89 mL/min — ABNORMAL LOW (ref >90)
Glucose, Bld: 103 mg/dL — ABNORMAL HIGH (ref 70–99)
Potassium: 3.7 mmol/L (ref 3.5–5.1)
SODIUM: 139 mmol/L (ref 135–145)
TOTAL PROTEIN: 4.2 g/dL — AB (ref 6.0–8.3)
Total Bilirubin: 0.6 mg/dL (ref 0.3–1.2)

## 2014-12-13 LAB — BLOOD GAS, ARTERIAL
ACID-BASE DEFICIT: 1 mmol/L (ref 0.0–2.0)
Bicarbonate: 23 mEq/L (ref 20.0–24.0)
Drawn by: 11249
O2 CONTENT: 2 L/min
O2 Saturation: 96.3 %
PCO2 ART: 36.9 mmHg (ref 35.0–45.0)
PH ART: 7.409 (ref 7.350–7.450)
Patient temperature: 98
TCO2: 20.9 mmol/L (ref 0–100)
pO2, Arterial: 101 mmHg — ABNORMAL HIGH (ref 80.0–100.0)

## 2014-12-13 LAB — APTT: APTT: 29 s (ref 24–37)

## 2014-12-13 LAB — TYPE AND SCREEN
ABO/RH(D): B POS
ANTIBODY SCREEN: NEGATIVE

## 2014-12-13 LAB — LACTIC ACID, PLASMA
LACTIC ACID, VENOUS: 1.3 mmol/L (ref 0.5–2.0)
Lactic Acid, Venous: 2.4 mmol/L (ref 0.5–2.0)
Lactic Acid, Venous: 3.1 mmol/L (ref 0.5–2.0)
Lactic Acid, Venous: 1.6 mmol/L (ref 0.5–2.0)

## 2014-12-13 LAB — TROPONIN I: Troponin I: <0.03 ng/mL (ref <0.031)

## 2014-12-13 LAB — CORTISOL: CORTISOL PLASMA: 11.9 ug/dL

## 2014-12-13 LAB — ABO/RH: ABO/RH(D): B POS

## 2014-12-13 LAB — MAGNESIUM: Magnesium: 1.6 mg/dL (ref 1.5–2.5)

## 2014-12-13 MED ORDER — SODIUM CHLORIDE 0.9 % IV SOLN
INTRAVENOUS | Status: DC
  Administered 2014-12-13 (×3): via INTRAVENOUS

## 2014-12-13 MED ORDER — SODIUM CHLORIDE 0.9 % IV BOLUS (SEPSIS)
1000.0000 mL | Freq: Once | INTRAVENOUS | Status: AC
  Administered 2014-12-13: 1000 mL via INTRAVENOUS

## 2014-12-13 MED ORDER — SIMVASTATIN 20 MG PO TABS
20.0000 mg | ORAL_TABLET | Freq: Every day | ORAL | Status: DC
  Administered 2014-12-13 – 2014-12-14 (×2): 20 mg via ORAL
  Filled 2014-12-13 (×2): qty 1

## 2014-12-13 MED ORDER — POTASSIUM CHLORIDE CRYS ER 20 MEQ PO TBCR: 20.0000 meq | EXTENDED_RELEASE_TABLET | Freq: Two times a day (BID) | ORAL | Status: DC

## 2014-12-13 MED ORDER — VANCOMYCIN HCL IN DEXTROSE 750-5 MG/150ML-% IV SOLN
750.0000 mg | Freq: Two times a day (BID) | INTRAVENOUS | Status: DC
  Administered 2014-12-13 – 2014-12-14 (×2): 750 mg via INTRAVENOUS
  Filled 2014-12-13 (×3): qty 150

## 2014-12-13 MED ORDER — PIPERACILLIN-TAZOBACTAM 3.375 G IVPB 30 MIN
3.3750 g | Freq: Once | INTRAVENOUS | Status: AC
  Administered 2014-12-13: 3.375 g via INTRAVENOUS
  Filled 2014-12-13: qty 50

## 2014-12-13 MED ORDER — ACETAMINOPHEN 500 MG PO TABS
500.0000 mg | ORAL_TABLET | ORAL | Status: DC | PRN
  Administered 2014-12-13: 500 mg via ORAL
  Filled 2014-12-13: qty 1

## 2014-12-13 MED ORDER — ENOXAPARIN SODIUM 40 MG/0.4ML ~~LOC~~ SOLN
40.0000 mg | Freq: Every day | SUBCUTANEOUS | Status: DC
  Administered 2014-12-13 – 2014-12-14 (×3): 40 mg via SUBCUTANEOUS
  Filled 2014-12-13 (×3): qty 0.4

## 2014-12-13 MED ORDER — ASPIRIN EC 81 MG PO TBEC
81.0000 mg | DELAYED_RELEASE_TABLET | Freq: Every day | ORAL | Status: DC
  Administered 2014-12-13 – 2014-12-14 (×2): 81 mg via ORAL
  Filled 2014-12-13 (×3): qty 1

## 2014-12-13 MED ORDER — VANCOMYCIN HCL IN DEXTROSE 1-5 GM/200ML-% IV SOLN
1000.0000 mg | Freq: Once | INTRAVENOUS | Status: AC
  Administered 2014-12-13: 1000 mg via INTRAVENOUS
  Filled 2014-12-13: qty 200

## 2014-12-13 MED ORDER — ONDANSETRON HCL 4 MG/2ML IJ SOLN
4.0000 mg | INTRAMUSCULAR | Status: DC | PRN
  Administered 2014-12-13: 4 mg via INTRAVENOUS
  Filled 2014-12-13: qty 2

## 2014-12-13 MED ORDER — POTASSIUM CHLORIDE CRYS ER 20 MEQ PO TBCR
20.0000 meq | EXTENDED_RELEASE_TABLET | Freq: Two times a day (BID) | ORAL | Status: AC
  Administered 2014-12-13 (×3): 20 meq via ORAL
  Filled 2014-12-13 (×3): qty 1

## 2014-12-13 MED ORDER — ALBUTEROL SULFATE HFA 108 (90 BASE) MCG/ACT IN AERS: 1.0000 | INHALATION_SPRAY | Freq: Four times a day (QID) | RESPIRATORY_TRACT | Status: DC | PRN

## 2014-12-13 MED ORDER — SULFASALAZINE 500 MG PO TABS
1000.0000 mg | ORAL_TABLET | Freq: Two times a day (BID) | ORAL | Status: DC
  Administered 2014-12-13 – 2014-12-14 (×5): 1000 mg via ORAL
  Filled 2014-12-13 (×6): qty 2

## 2014-12-13 MED ORDER — DICYCLOMINE HCL 10 MG PO CAPS
10.0000 mg | ORAL_CAPSULE | Freq: Two times a day (BID) | ORAL | Status: DC
  Administered 2014-12-13 – 2014-12-15 (×5): 10 mg via ORAL
  Filled 2014-12-13 (×6): qty 1

## 2014-12-13 MED ORDER — MAGNESIUM SULFATE 2 GM/50ML IV SOLN
2.0000 g | Freq: Once | INTRAVENOUS | Status: AC
Start: 2014-12-13 — End: 2014-12-13
  Administered 2014-12-13: 2 g via INTRAVENOUS
  Filled 2014-12-13: qty 50

## 2014-12-13 MED ORDER — ALBUTEROL SULFATE (2.5 MG/3ML) 0.083% IN NEBU: 2.5000 mg | INHALATION_SOLUTION | Freq: Four times a day (QID) | RESPIRATORY_TRACT | Status: DC | PRN

## 2014-12-13 MED ORDER — PIPERACILLIN-TAZOBACTAM 3.375 G IVPB
3.3750 g | Freq: Three times a day (TID) | INTRAVENOUS | Status: DC
  Administered 2014-12-13 – 2014-12-15 (×7): 3.375 g via INTRAVENOUS
  Filled 2014-12-13 (×7): qty 50

## 2014-12-13 MED ORDER — POTASSIUM CHLORIDE 10 MEQ/100ML IV SOLN
10.0000 meq | INTRAVENOUS | Status: DC
  Administered 2014-12-13: 10 meq via INTRAVENOUS
  Filled 2014-12-13: qty 100

## 2014-12-13 MED ORDER — FOLIC ACID 1 MG PO TABS
1.0000 mg | ORAL_TABLET | Freq: Every day | ORAL | Status: DC
  Administered 2014-12-13 – 2014-12-14 (×2): 1 mg via ORAL
  Filled 2014-12-13 (×3): qty 1

## 2014-12-13 NOTE — Progress Notes (Signed)
CRITICAL VALUE ALERT  Critical value received:  Lactic Acid  Date of notification:  12/13/14  Time of notification:  0420  Critical value read back:Yes.    Nurse who received alert:  Mercer Pod D   MD notified (1st page):  Dr Joseph Art  Time of first page:  0425  MD notified (2nd page):  Time of second page:  Responding MD:  Dr Joseph Art  Time MD responded: [phone]

## 2014-12-13 NOTE — Progress Notes (Signed)
[facility] PROGRESS NOTE  Cassandra Garcia ZOX:[id number] DOB: May 27, 1935 DOA: 12/12/2014 PCP: Pearla Dubonnet, MD  Assessment/Plan: Sepsis unclear etiology, might be related to viral infection.  -Follow up blood/urine culture  -Influenza panel negative /respiratory virus panel pending -lactic acid 3.1---1.3 trending down.  -continue with IV fluids, and antibiotics.  -Cortisol at 11.   Essential hypertension -Hold home furosemide and lisinopril secondary to hypotension -Continue normal saline 125ml/hr -ECHO ordered.   Asthma chronic -When necessary albuterol  Crohn's disease -denies abdominal pain or diarrhea.  -Continue sulfasalazine 1000 mg BID -Occult blood Diverticulosis of colon  Hypokalemia; improved.  Mg at 1.6, will replete.    Code Status:  Family Communication: Care discussed with patient.  Disposition Plan: Remain inpatient.    Consultants:  none  Procedures:  ECHO pending.   Antibiotics:  Zosyn 4-03  Vancomycin 4-04  HPI/Subjective: She is feeling better. No more vomiting. She vomited multiples time prior to admission.  Was able to tolerates breakfast today.  She denies dyspnea, cough, dysuria. No abdominal pain.    Objective: Filed Vitals:   12/13/14 0430  BP: 117/50  Pulse: 81  Temp: 98 F (36.7 C)  Resp: 20    Intake/Output Summary (Last 24 hours) at 12/13/14 0919 Last data filed at 12/12/14 2248  Gross per 24 hour  Intake      0 ml  Output     15 ml  Net    -15 ml   Filed Weights   12/12/14 1852 12/12/14 2359  Weight: 70.761 kg (156 lb) 74.8 kg (164 lb 14.5 oz)    Exam:   General:  Alert in no distress.   Cardiovascular: S 1, S 2 RRR  Respiratory: CTA  Abdomen: bs present, soft, nt  Musculoskeletal: no edema.   Data Reviewed: Basic Metabolic Panel:  Recent Labs Lab 12/12/14 1913 12/13/14 0622  NA 140 139  K 3.4* 3.7  CL 107 108  CO2 22 25  GLUCOSE 124* 103*  BUN 23 12  CREATININE 0.56 0.53   CALCIUM 8.7 7.5*  MG  --  1.6   Liver Function Tests:  Recent Labs Lab 12/12/14 1913 12/13/14 0622  AST 21 19  ALT 14 12  ALKPHOS 73 52  BILITOT 0.7 0.6  PROT 5.8* 4.2*  ALBUMIN 4.0 2.9*    Recent Labs Lab 12/12/14 1913  LIPASE 27   No results for input(s): AMMONIA in the last 168 hours. CBC:  Recent Labs Lab 12/12/14 1913  WBC 13.7*  NEUTROABS 12.7*  HGB 15.0  HCT 45.4  MCV 98.9  PLT 211   Cardiac Enzymes:  Recent Labs Lab 12/13/14 0007  TROPONINI <0.03   BNP (last 3 results) No results for input(s): BNP in the last 8760 hours.  ProBNP (last 3 results) No results for input(s): PROBNP in the last 8760 hours.  CBG: No results for input(s): GLUCAP in the last 168 hours.  No results found for this or any previous visit (from the past 240 hour(s)).   Studies: Dg Chest 2 View  12/12/2014   CLINICAL DATA:  Fever. Nausea and vomiting. Bronchitis. Asthma. Hypertension.  EXAM: CHEST  2 VIEW  COMPARISON:  07/26/2011  FINDINGS: Hiatal hernia.  Thoracic spondylosis.  Heart size within normal limits. Mild scarring or subsegmental atelectasis in the right lower lobe.  No pleural effusion or additional airspace opacities.  IMPRESSION: 1. Mild right basilar scarring. 2. Small to moderate hiatal hernia. 3. Thoracic spondylosis.   Electronically Signed   By: Zollie Beckers  Ova Freshwater M.D.   On: 12/12/2014 19:24    Scheduled Meds: . aspirin EC  81 mg Oral Daily  . dicyclomine  10 mg Oral BID AC  . enoxaparin (LOVENOX) injection  40 mg Subcutaneous QHS  . folic acid  1 mg Oral Daily  . piperacillin-tazobactam (ZOSYN)  IV  3.375 g Intravenous 3 times per day  . potassium chloride  20 mEq Oral BID  . simvastatin  20 mg Oral QHS  . sulfaSALAzine  1,000 mg Oral BID  . vancomycin  750 mg Intravenous Q12H   Continuous Infusions: . sodium chloride 200 mL/hr at 12/13/14 9417    Principal Problem:   Sepsis Active Problems:   Diverticulosis of colon   Essential hypertension    Crohn's disease   Anemia of chronic disease   Asthma, chronic   Sepsis due to other etiology   Hypokalemia    Time spent: 35 minutes.     Hartley Barefoot A  [facility] Pager [phone]. If 7PM-7AM, please contact night-coverage at [email or web address], password [facility] 12/13/2014, 9:19 AM  LOS: 1 day

## 2014-12-13 NOTE — Progress Notes (Signed)
Echocardiogram 2D Echocardiogram has been performed.  Dorothey Baseman 12/13/2014, 2:56 PM

## 2014-12-13 NOTE — Progress Notes (Signed)
ANTIBIOTIC CONSULT NOTE - INITIAL  Pharmacy Consult for Vancomycin and Zosyn  Indication: sepsis  Allergies  Allergen Reactions  . Iron Dextran Diarrhea and Nausea And Vomiting    Patient Measurements: Height:  (165.1 cm) Weight: 164 lb 14.5 oz (74.8 kg) IBW/kg (Calculated) : 57 Adjusted Body Weight:   Vital Signs: Temp: 98.6 F (37 C) (04/03 2359) Temp Source: Oral (04/03 2359) BP: 126/46 mmHg (04/03 2359) Pulse Rate: 92 (04/03 2359) Intake/Output from previous day: 04/03 0701 - 04/04 0700 In: -  Out: 15 [Urine:15] Intake/Output from this shift: Total I/O In: -  Out: 15 [Urine:15]  Labs:  Recent Labs  12/12/14 1913  WBC 13.7*  HGB 15.0  PLT 211  CREATININE 0.56   Estimated Creatinine Clearance: 59.6 mL/min (by C-G formula based on Cr of 0.56). No results for input(s): VANCOTROUGH, VANCOPEAK, VANCORANDOM, GENTTROUGH, GENTPEAK, GENTRANDOM, TOBRATROUGH, TOBRAPEAK, TOBRARND, AMIKACINPEAK, AMIKACINTROU, AMIKACIN in the last 72 hours.   Microbiology: No results found for this or any previous visit (from the past 720 hour(s)).  Medical History: Past Medical History  Diagnosis Date  . Anemia   . Nephrolithiasis   . Acute asthmatic bronchitis   . Hypertension   . Diverticulosis of colon   . Nausea   . Abdominal pain, left lower quadrant   . Crohn's disease     Medications:  Anti-infectives    Start     Dose/Rate Route Frequency Ordered Stop   12/13/14 1200  vancomycin (VANCOCIN) IVPB 750 mg/150 ml premix     750 mg 150 mL/hr over 60 Minutes Intravenous Every 12 hours 12/13/14 0218     12/13/14 0800  piperacillin-tazobactam (ZOSYN) IVPB 3.375 g     3.375 g 12.5 mL/hr over 240 Minutes Intravenous 3 times per day 12/13/14 0218     12/13/14 0015  piperacillin-tazobactam (ZOSYN) IVPB 3.375 g     3.375 g 100 mL/hr over 30 Minutes Intravenous  Once 12/13/14 0007     12/13/14 0015  vancomycin (VANCOCIN) IVPB 1000 mg/200 mL premix     1,000 mg 200 mL/hr  over 60 Minutes Intravenous  Once 12/13/14 0007     12/12/14 2230  cefTRIAXone (ROCEPHIN) 1 g in dextrose 5 % 50 mL IVPB     1 g 100 mL/hr over 30 Minutes Intravenous  Once 12/12/14 2227 12/12/14 2331     Assessment: 80 y.o. WF PMHx asthma, essential HTN, Crohn's disease diverticulosis of colon, anemia chronic disease--in ED with N/V and fatigue. First dose of antibiotics already ordered. Pharmacy to dose antibiotics for sepsis  Goal of Therapy:  Vancomycin trough level 15-20 mcg/ml  Zosyn based on renal function   Plan:  Measure antibiotic drug levels at steady state Follow up culture results Vancomycin  iv q12hr  Zosyn 3.375g IV Q8H infused over 4hrs.   Darlina Guys, Jacquenette Shone Crowford 12/13/2014,2:23 AM

## 2014-12-14 LAB — BASIC METABOLIC PANEL
ANION GAP: 4 — AB (ref 5–15)
BUN: 10 mg/dL (ref 6–23)
CHLORIDE: 108 mmol/L (ref 96–112)
CO2: 27 mmol/L (ref 19–32)
CREATININE: 0.53 mg/dL (ref 0.50–1.10)
Calcium: 7.9 mg/dL — ABNORMAL LOW (ref 8.4–10.5)
GFR calc Af Amer: >90 mL/min (ref >90)
GFR calc non Af Amer: 89 mL/min — ABNORMAL LOW (ref >90)
Glucose, Bld: 108 mg/dL — ABNORMAL HIGH (ref 70–99)
POTASSIUM: 4.1 mmol/L (ref 3.5–5.1)
Sodium: 139 mmol/L (ref 135–145)

## 2014-12-14 LAB — URINE CULTURE
CULTURE: NO GROWTH
Colony Count: NO GROWTH

## 2014-12-14 LAB — CBC
HCT: 36.5 % (ref 36.0–46.0)
HEMOGLOBIN: 11.7 g/dL — AB (ref 12.0–15.0)
MCH: 32.6 pg (ref 26.0–34.0)
MCHC: 32.1 g/dL (ref 30.0–36.0)
MCV: 101.7 fL — AB (ref 78.0–100.0)
Platelets: 170 10*3/uL (ref 150–400)
RBC: 3.59 MIL/uL — ABNORMAL LOW (ref 3.87–5.11)
RDW: 12.9 % (ref 11.5–15.5)
WBC: 6.1 10*3/uL (ref 4.0–10.5)

## 2014-12-14 LAB — OCCULT BLOOD X 1 CARD TO LAB, STOOL: Fecal Occult Bld: NEGATIVE

## 2014-12-14 NOTE — Progress Notes (Signed)
[facility] PROGRESS NOTE  Cassandra Garcia EQA:[id number] DOB: Aug 24, 1935 DOA: 12/12/2014 PCP: Pearla Dubonnet, MD  Assessment/Plan: Cassandra Garcia is a 80 y.o. WF PMHx asthma (has not required rescue inhaler in 4 years) essential HTN, Crohn's disease, diverticulosis of colon, anemia chronic disease. follows with Dr. [name] (gastroenterology)  Presents with nausea and vomiting, negative diarrhea and fever that started day of admission. She was found to be hypotensive SBP in the 80, WBC at 13, Lactic acid at 3. She was started on IV fluids, IV vancomycin and zosyn. He BP normalized with IV fluids. Vancomycin stopped 4-5. Culture data pending.   Sepsis unclear etiology, might be related to viral infection.  -Follow up blood/urine culture pending at this time -Influenza panel negative /respiratory virus panel pending -lactic acid 3.1---1.3 trending down.  -continue with IV  antibiotics.  -Cortisol at 11.  -BP stable.   Essential hypertension -Hold home furosemide and lisinopril secondary to hypotension -Continue normal saline 129ml/hr -ECHO pending.   Asthma chronic -When necessary albuterol  Crohn's disease -denies abdominal pain or diarrhea.  -Continue sulfasalazine 1000 mg BID -Occult blood Diverticulosis of colon  Hypokalemia; improved.  Mg at 1.6, replaced.     Code Status:  Family Communication: Care discussed with patient.  Disposition Plan: home when cuture data available, might be able to be discharge 4-6 if stable.   Consultants:  none  Procedures:  ECHO pending.   Antibiotics:  Zosyn 4-03  Vancomycin 4-04  HPI/Subjective: Feeling better, no more vomiting. No abdominal pain.     Objective: Filed Vitals:   12/14/14 0606  BP: 131/64  Pulse: 88  Temp: 98.2 F (36.8 C)  Resp: 20    Intake/Output Summary (Last 24 hours) at 12/14/14 1217 Last data filed at 12/14/14 0900  Gross per 24 hour  Intake   1720 ml  Output   2000 ml  Net    -280 ml   Filed Weights   12/12/14 1852 12/12/14 2359  Weight: 70.761 kg (156 lb) 74.8 kg (164 lb 14.5 oz)    Exam:   General:  Alert in no distress.   Cardiovascular: S 1, S 2 RRR  Respiratory: CTA  Abdomen: bs present, soft, nt  Musculoskeletal: no edema.   Data Reviewed: Basic Metabolic Panel:  Recent Labs Lab 12/12/14 1913 12/13/14 0622 12/14/14 0452  NA 140 139 139  K 3.4* 3.7 4.1  CL 107 108 108  CO2 22 25 27   GLUCOSE 124* 103* 108*  BUN 23 12 10   CREATININE 0.56 0.53 0.53  CALCIUM 8.7 7.5* 7.9*  MG  --  1.6  --    Liver Function Tests:  Recent Labs Lab 12/12/14 1913 12/13/14 0622  AST 21 19  ALT 14 12  ALKPHOS 73 52  BILITOT 0.7 0.6  PROT 5.8* 4.2*  ALBUMIN 4.0 2.9*    Recent Labs Lab 12/12/14 1913  LIPASE 27   No results for input(s): AMMONIA in the last 168 hours. CBC:  Recent Labs Lab 12/12/14 1913 12/14/14 0452  WBC 13.7* 6.1  NEUTROABS 12.7*  --   HGB 15.0 11.7*  HCT 45.4 36.5  MCV 98.9 101.7*  PLT 211 170   Cardiac Enzymes:  Recent Labs Lab 12/13/14 0007  TROPONINI <0.03   BNP (last 3 results) No results for input(s): BNP in the last 8760 hours.  ProBNP (last 3 results) No results for input(s): PROBNP in the last 8760 hours.  CBG: No results for input(s): GLUCAP in the last 168 hours.  Recent Results (from the past 240 hour(s))  Blood culture (routine x 2)     Status: None (Preliminary result)   Collection Time: 12/12/14 10:56 PM  Result Value Ref Range Status   Specimen Description BLOOD BLOOD LEFT FOREARM  Final   Special Requests BOTTLES DRAWN AEROBIC AND ANAEROBIC  Final   Culture   Final           BLOOD CULTURE RECEIVED NO GROWTH TO DATE CULTURE WILL BE HELD FOR 5 DAYS BEFORE ISSUING A FINAL NEGATIVE REPORT Performed at [facility]    Report Status PENDING  Incomplete  Blood culture (routine x 2)     Status: None (Preliminary result)   Collection Time: 12/12/14 11:18 PM  Result Value  Ref Range Status   Specimen Description BLOOD RIGHT HAND  Final   Special Requests BOTTLES DRAWN AEROBIC ONLY 3CC  Final   Culture   Final           BLOOD CULTURE RECEIVED NO GROWTH TO DATE CULTURE WILL BE HELD FOR 5 DAYS BEFORE ISSUING A FINAL NEGATIVE REPORT Performed at [facility]    Report Status PENDING  Incomplete     Studies: Dg Chest 2 View  12/12/2014   CLINICAL DATA:  Fever. Nausea and vomiting. Bronchitis. Asthma. Hypertension.  EXAM: CHEST  2 VIEW  COMPARISON:  07/26/2011  FINDINGS: Hiatal hernia.  Thoracic spondylosis.  Heart size within normal limits. Mild scarring or subsegmental atelectasis in the right lower lobe.  No pleural effusion or additional airspace opacities.  IMPRESSION: 1. Mild right basilar scarring. 2. Small to moderate hiatal hernia. 3. Thoracic spondylosis.   Electronically Signed   By: Gaylyn Rong M.D.   On: 12/12/2014 19:24    Scheduled Meds: . aspirin EC  81 mg Oral Daily  . dicyclomine  10 mg Oral BID AC  . enoxaparin (LOVENOX) injection  40 mg Subcutaneous QHS  . folic acid  1 mg Oral Daily  . piperacillin-tazobactam (ZOSYN)  IV  3.375 g Intravenous 3 times per day  . simvastatin  20 mg Oral QHS  . sulfaSALAzine  1,000 mg Oral BID   Continuous Infusions:    Principal Problem:   Sepsis Active Problems:   Diverticulosis of colon   Essential hypertension   Crohn's disease   Anemia of chronic disease   Asthma, chronic   Sepsis due to other etiology   Hypokalemia    Time spent: 35 minutes.     Hartley Barefoot A  [facility] Pager [phone]. If 7PM-7AM, please contact night-coverage at [email or web address], password [facility] 12/14/2014, 12:17 PM  LOS: 2 days

## 2014-12-15 LAB — RESPIRATORY VIRUS PANEL
Adenovirus: NEGATIVE
INFLUENZA A: NEGATIVE
INFLUENZA B 1: NEGATIVE
Metapneumovirus: NEGATIVE
PARAINFLUENZA 1 A: NEGATIVE
PARAINFLUENZA 3 A: NEGATIVE
Parainfluenza 2: NEGATIVE
Respiratory Syncytial Virus A: NEGATIVE
Respiratory Syncytial Virus B: NEGATIVE
Rhinovirus: NEGATIVE

## 2014-12-15 MED ORDER — ONDANSETRON HCL 4 MG PO TABS: 4.0000 mg | ORAL_TABLET | Freq: Four times a day (QID) | ORAL | Status: AC | PRN

## 2014-12-15 NOTE — Evaluation (Signed)
Physical Therapy Evaluation Patient Details Name: Cassandra Garcia MRN: [id number] DOB: [birth year] Today's Date: 12/15/2014   History of Present Illness  80 yo female with onset of sepsis from possibly viral source, going to home today  Clinical Impression  Pt is demonstrating some changes of gait without a balance loss, still driving and would be able to get to outpatient therapy after discharge.  Since MD and nursing have done dc to prepare for home, contacted MD to ask for the order to outpatient, and pt will follow up near her home.    Follow Up Recommendations Outpatient PT    Equipment Recommendations  None recommended by PT    Recommendations for Other Services       Precautions / Restrictions Precautions Precautions: Other (comment) (Droplet) Restrictions Weight Bearing Restrictions: No      Mobility  Bed Mobility               General bed mobility comments: Up when PT entered  Transfers Overall transfer level: Modified independent Equipment used: None                Ambulation/Gait Ambulation/Gait assistance: Modified independent (Device/Increase time) Ambulation Distance (Feet): 12 Feet Assistive device: None Gait Pattern/deviations: Step-through pattern;Decreased step length - right;Decreased step length - left;Decreased dorsiflexion - right;Decreased dorsiflexion - left;Shuffle;Wide base of support (Minimal knee flexion) Gait velocity: reduced Gait velocity interpretation: Below normal speed for age/gender General Gait Details: Pt is getting up to walk independently but stiff legged and poor fluidity  Stairs            Wheelchair Mobility    Modified Rankin (Stroke Patients Only)       Balance Overall balance assessment: Modified Independent;No apparent balance deficits (not formally assessed)                               Standardized Balance Assessment Standardized Balance Assessment : TUG: Timed Up and Go Test      Timed Up and Go Test TUG: Cognitive TUG Cognitive TUG (seconds): 15.57     Pertinent Vitals/Pain Pain Assessment: No/denies pain    Home Living Family/patient expects to be discharged to:: Private residence Living Arrangements: Alone Available Help at Discharge: Friend(s);Family;Available PRN/intermittently Type of Home: House Home Access: Level entry     Home Layout: One level Home Equipment: Cane - single point      Prior Function Level of Independence: Independent               Hand Dominance        Extremity/Trunk Assessment   Upper Extremity Assessment: Overall WFL for tasks assessed           Lower Extremity Assessment: Generalized weakness      Cervical / Trunk Assessment: Normal  Communication   Communication: No difficulties  Cognition Arousal/Alertness: Awake/alert Behavior During Therapy: WFL for tasks assessed/performed Overall Cognitive Status: Within Functional Limits for tasks assessed                      General Comments General comments (skin integrity, edema, etc.): Pt is demonstrating some endurance and strength changes that would be good to address with outpatient therapy to avoid an increased fall risk, MD contacted to request the order as nursing had already given paper work to pt before PT arrived    Exercises        Assessment/Plan    PT Assessment  Patient needs continued PT services  PT Diagnosis Generalized weakness   PT Problem List Decreased strength;Decreased range of motion;Decreased activity tolerance;Decreased balance;Decreased mobility;Decreased coordination;Decreased safety awareness;Obesity  PT Treatment Interventions Gait training;Functional mobility training;Therapeutic activities;Therapeutic exercise;Balance training;Neuromuscular re-education;Patient/family education   PT Goals (Current goals can be found in the Care Plan section) Acute Rehab PT Goals Patient Stated Goal: to go home today and  recover her energy PT Goal Formulation: With patient Time For Goal Achievement: 12/29/14 Potential to Achieve Goals: Good    Frequency Min 3X/week   Barriers to discharge Decreased caregiver support Sister was there and has recently passed away    Co-evaluation               End of Session   Activity Tolerance: Patient tolerated treatment well Patient left: in chair;with call bell/phone within reach Nurse Communication: Mobility status;Other (comment) (Need for follow up therapy orders)         Time: 1610-9604 PT Time Calculation (min) (ACUTE ONLY): 16 min   Charges:   PT Evaluation $Initial PT Evaluation Tier I: 1 Procedure     PT G CodesIvar Drape 01/03/2015, 11:26 AM  Samul Dada, PT MS Acute Rehab Dept. Number: [phone]

## 2014-12-15 NOTE — Discharge Summary (Signed)
Physician Discharge Summary  Jenniger Figiel UJW:[id number] DOB: Dec 06, 1934 DOA: 12/12/2014  PCP: Pearla Dubonnet, MD  Admit date: 12/12/2014 Discharge date: 12/15/2014  Time spent: 45 minutes    Discharge Condition: Stable Diet recommendation: Low sodium heart healthy  Discharge Diagnoses:  Principal Problem:   Sepsis- possibly viral gastroenteritis Active Problems:   Diverticulosis of colon   Essential hypertension   Crohn's disease   Anemia of chronic disease   Asthma, chronic   Hypokalemia   History of present illness:  This is a 80 year old female with past medical history of Crohn's disease, essential hypertension, diverticulosis, anemia of chronic disease and asthma that is well controlled. She presented with nausea vomiting fever and an episode of watery stool and was found to be hypotensive with a systolic blood pressure of 80, lactic acid of 3 and a white count of 13. She was started on vancomycin and Zosyn in the ER and was admitted.  Hospital Course:  Sepsis -possibly viral gastroenteritis - blood/urine cultures negative-vancomycin discontinued on 4/4-DC Zosyn today -Flulike symptoms but Influenza checked and was found to be  negative /respiratory virus panel pending as mentioned, she had no respiratory symptoms -lactic acid 3.1 improved to 1 3  -Cortisol at 11.  - All symptoms resolved she is now eating and drinking well and had a solid bowel movement this morning  Dehydration - Received continuous IV fluid hydration and Lasix was held  Essential hypertension -We were holding home furosemide and lisinopril secondary to hypotension - BP improved to 120 systolic therefore medications have been resumed on discharge to be started tomorrow -ECHO revealed an EF of 50-55% with grade 1 diastolic dysfunction  Asthma chronic -Controlled  Crohn's disease -Continue sulfasalazine 1000 mg BID   Hypokalemia - Replaced and  resolved   Procedures:  None  Consultations:  None  Discharge Exam: Filed Weights   12/12/14 1852 12/12/14 2359  Weight: 70.761 kg (156 lb) 74.8 kg (164 lb 14.5 oz)   Filed Vitals:   12/15/14 0535  BP: 125/61  Pulse: 77  Temp: 98.2 F (36.8 C)  Resp: 18    General: AAO x 3, no distress Cardiovascular: RRR, no murmurs  Respiratory: clear to auscultation bilaterally GI: soft, non-tender, non-distended, bowel sound positive  Discharge Instructions You were cared for by a hospitalist during your hospital stay. If you have any questions about your discharge medications or the care you received while you were in the hospital after you are discharged, you can call the unit and asked to speak with the hospitalist on call if the hospitalist that took care of you is not available. Once you are discharged, your primary care physician will handle any further medical issues. Please note that NO REFILLS for any discharge medications will be authorized once you are discharged, as it is imperative that you return to your primary care physician (or establish a relationship with a primary care physician if you do not have one) for your aftercare needs so that they can reassess your need for medications and monitor your lab values.  Discharge Instructions    Diet - low sodium heart healthy    Complete by:  As directed      Increase activity slowly    Complete by:  As directed             Medication List    TAKE these medications        acetaminophen 500 MG tablet  Commonly known as:  TYLENOL  Take 500 mg  by mouth as needed for mild pain or headache.     albuterol 108 (90 BASE) MCG/ACT inhaler  Commonly known as:  PROVENTIL HFA;VENTOLIN HFA  Inhale 1 puff into the lungs every 6 (six) hours as needed. asthma     aspirin 81 MG tablet  Take 81 mg by mouth daily.     dicyclomine 10 MG capsule  Commonly known as:  BENTYL  TAKE 1 CAPSULE TWICE DAILY     folic acid 1 MG tablet   Commonly known as:  FOLVITE  TAKE 1 TABLET EVERY DAY (MUST KEEP COLONOSCOPY 08/2014 FOR FURTHER REFILLS)     furosemide 20 MG tablet  Commonly known as:  LASIX  Take 20 mg by mouth daily.     lisinopril 10 MG tablet  Commonly known as:  PRINIVIL,ZESTRIL  Take 10 mg by mouth daily.     ondansetron 4 MG tablet  Commonly known as:  ZOFRAN  Take 1 tablet (4 mg total) by mouth every 6 (six) hours as needed for nausea or vomiting.     simvastatin 20 MG tablet  Commonly known as:  ZOCOR  Take 20 mg by mouth at bedtime.     sulfaSALAzine 500 MG tablet  Commonly known as:  AZULFIDINE  TAKE 2 TABLETS TWICE DAILY (MUST HAVE OFFICE VISIT FOR FURTHER REFILLS)       Allergies  Allergen Reactions  . Iron Dextran Diarrhea and Nausea And Vomiting      The results of significant diagnostics from this hospitalization (including imaging, microbiology, ancillary and laboratory) are listed below for reference.    Significant Diagnostic Studies: Dg Chest 2 View  12/12/2014   CLINICAL DATA:  Fever. Nausea and vomiting. Bronchitis. Asthma. Hypertension.  EXAM: CHEST  2 VIEW  COMPARISON:  07/26/2011  FINDINGS: Hiatal hernia.  Thoracic spondylosis.  Heart size within normal limits. Mild scarring or subsegmental atelectasis in the right lower lobe.  No pleural effusion or additional airspace opacities.  IMPRESSION: 1. Mild right basilar scarring. 2. Small to moderate hiatal hernia. 3. Thoracic spondylosis.   Electronically Signed   By: Gaylyn Rong M.D.   On: 12/12/2014 19:24    Microbiology: Recent Results (from the past 240 hour(s))  Blood culture (routine x 2)     Status: None (Preliminary result)   Collection Time: 12/12/14 10:56 PM  Result Value Ref Range Status   Specimen Description BLOOD BLOOD LEFT FOREARM  Final   Special Requests BOTTLES DRAWN AEROBIC AND ANAEROBIC  Final   Culture   Final           BLOOD CULTURE RECEIVED NO GROWTH TO DATE CULTURE WILL BE HELD FOR 5 DAYS  BEFORE ISSUING A FINAL NEGATIVE REPORT Performed at [facility]    Report Status PENDING  Incomplete  Blood culture (routine x 2)     Status: None (Preliminary result)   Collection Time: 12/12/14 11:18 PM  Result Value Ref Range Status   Specimen Description BLOOD RIGHT HAND  Final   Special Requests BOTTLES DRAWN AEROBIC ONLY 3CC  Final   Culture   Final           BLOOD CULTURE RECEIVED NO GROWTH TO DATE CULTURE WILL BE HELD FOR 5 DAYS BEFORE ISSUING A FINAL NEGATIVE REPORT Performed at [facility]    Report Status PENDING  Incomplete  Urine culture     Status: None   Collection Time: 12/13/14 12:27 PM  Result Value Ref Range Status   Specimen  Description URINE, CLEAN CATCH  Final   Special Requests NONE  Final   Colony Count NO GROWTH Performed at [facility]   Final   Culture NO GROWTH Performed at [facility]   Final   Report Status 12/14/2014 FINAL  Final     Labs: Basic Metabolic Panel:  Recent Labs Lab 12/12/14 1913 12/13/14 0622 12/14/14 0452  NA 140 139 139  K 3.4* 3.7 4.1  CL 107 108 108  CO2 GLUCOSE 124* 103* 108*  BUN CREATININE 0.56 0.53 0.53  CALCIUM 8.7 7.5* 7.9*  MG  --  1.6  --    Liver Function Tests:  Recent Labs Lab 12/12/14 1913 12/13/14 0622  AST 21 19  ALT 14 12  ALKPHOS 73 52  BILITOT 0.7 0.6  PROT 5.8* 4.2*  ALBUMIN 4.0 2.9*    Recent Labs Lab 12/12/14 1913  LIPASE 27   No results for input(s): AMMONIA in the last 168 hours. CBC:  Recent Labs Lab 12/12/14 1913 12/14/14 0452  WBC 13.7* 6.1  NEUTROABS 12.7*  --   HGB 15.0 11.7*  HCT 45.4 36.5  MCV 98.9 101.7*  PLT 211 170   Cardiac Enzymes:  Recent Labs Lab 12/13/14 0007  TROPONINI <0.03   BNP: BNP (last 3 results) No results for input(s): BNP in the last 8760 hours.  ProBNP (last 3 results) No results for input(s): PROBNP in the last 8760 hours.  CBG: No results for input(s): GLUCAP in the last  168 hours.     SignedCalvert Cantor, MD [facility] 12/15/2014, 8:49 AM

## 2014-12-16 ENCOUNTER — Other Ambulatory Visit: Payer: Self-pay | Admitting: Internal Medicine

## 2014-12-19 LAB — CULTURE, BLOOD (ROUTINE X 2)
CULTURE: NO GROWTH
CULTURE: NO GROWTH

## 2014-12-20 ENCOUNTER — Other Ambulatory Visit: Payer: Self-pay | Admitting: Internal Medicine

## 2014-12-23 ENCOUNTER — Telehealth: Payer: Self-pay | Admitting: Internal Medicine

## 2014-12-24 MED ORDER — SULFASALAZINE 500 MG PO TABS: 500.0000 mg | ORAL_TABLET | Freq: Two times a day (BID) | ORAL | Status: DC

## 2014-12-24 NOTE — Telephone Encounter (Signed)
Rx sent for sulfasalazine (AZULFIDINE), 500 mg, #160 with no refills to [facility] on 12/24/14. Called patient at [phone] and left message letting them know Rx was sent for 160 with no refills. Patient must come to the next appointment with Dr. Juanda Chance on 01/28/15 at 2:15 pm or future refills for this Rx will not be given.

## 2014-12-27 DIAGNOSIS — K5090 Crohn's disease, unspecified, without complications: Secondary | ICD-10-CM | POA: Diagnosis not present

## 2014-12-27 DIAGNOSIS — R000 Tachycardia, unspecified: Secondary | ICD-10-CM | POA: Diagnosis not present

## 2014-12-27 DIAGNOSIS — K529 Noninfective gastroenteritis and colitis, unspecified: Secondary | ICD-10-CM | POA: Diagnosis not present

## 2014-12-27 DIAGNOSIS — I10 Essential (primary) hypertension: Secondary | ICD-10-CM | POA: Diagnosis not present

## 2014-12-28 ENCOUNTER — Telehealth: Payer: Self-pay | Admitting: *Deleted

## 2014-12-28 NOTE — Telephone Encounter (Signed)
Called [facility] on 12/28/14 at 12:54 pm and talked to [facility], pharmacist, to clarify how the patient is to take their Rx for sulfasalazine, 500 mg. Pt is to take two tablets, by mouth, twice a day. Dispensed 160 tablets with no refills (40-day supply). [facility] will send Rx to patient within the week. Patient has an appointment with Dr. Juanda Chance on 01/28/15 at 2:15 pm. Patient must keep appointment or they will not get any more refills on this medication.

## 2015-01-07 ENCOUNTER — Other Ambulatory Visit: Payer: Self-pay | Admitting: Internal Medicine

## 2015-01-11 ENCOUNTER — Other Ambulatory Visit: Payer: Self-pay | Admitting: Internal Medicine

## 2015-01-25 ENCOUNTER — Other Ambulatory Visit: Payer: Self-pay | Admitting: Internal Medicine

## 2015-01-28 ENCOUNTER — Other Ambulatory Visit (INDEPENDENT_AMBULATORY_CARE_PROVIDER_SITE_OTHER): Payer: Medicare Other

## 2015-01-28 ENCOUNTER — Ambulatory Visit (INDEPENDENT_AMBULATORY_CARE_PROVIDER_SITE_OTHER): Payer: Medicare Other | Admitting: Internal Medicine

## 2015-01-28 ENCOUNTER — Encounter: Payer: Self-pay | Admitting: Internal Medicine

## 2015-01-28 VITALS — BP 114/68 | HR 82 | Ht 65.0 in | Wt 164.0 lb

## 2015-01-28 DIAGNOSIS — R195 Other fecal abnormalities: Secondary | ICD-10-CM | POA: Diagnosis not present

## 2015-01-28 DIAGNOSIS — D509 Iron deficiency anemia, unspecified: Secondary | ICD-10-CM

## 2015-01-28 DIAGNOSIS — K5090 Crohn's disease, unspecified, without complications: Secondary | ICD-10-CM

## 2015-01-28 LAB — CBC WITH DIFFERENTIAL/PLATELET
BASOS ABS: 0 10*3/uL (ref 0.0–0.1)
Basophils Relative: 0.5 % (ref 0.0–3.0)
EOS ABS: 0.3 10*3/uL (ref 0.0–0.7)
EOS PCT: 4.5 % (ref 0.0–5.0)
HCT: 38.1 % (ref 36.0–46.0)
Hemoglobin: 12.7 g/dL (ref 12.0–15.0)
Lymphocytes Relative: 17.9 % (ref 12.0–46.0)
Lymphs Abs: 1.3 10*3/uL (ref 0.7–4.0)
MCHC: 33.3 g/dL (ref 30.0–36.0)
MCV: 89.9 fl (ref 78.0–100.0)
Monocytes Absolute: 0.6 10*3/uL (ref 0.1–1.0)
Monocytes Relative: 8.6 % (ref 3.0–12.0)
Neutro Abs: 5.1 10*3/uL (ref 1.4–7.7)
Neutrophils Relative %: 68.5 % (ref 43.0–77.0)
Platelets: 280 10*3/uL (ref 150.0–400.0)
RBC: 4.24 Mil/uL (ref 3.87–5.11)
RDW: 13.9 % (ref 11.5–15.5)
WBC: 7.4 10*3/uL (ref 4.0–10.5)

## 2015-01-28 LAB — IBC PANEL
Iron: 63 ug/dL (ref 42–145)
Saturation Ratios: 16.5 % — ABNORMAL LOW (ref 20.0–50.0)
TRANSFERRIN: 272 mg/dL (ref 212.0–360.0)

## 2015-01-28 LAB — SEDIMENTATION RATE: Sed Rate: 10 mm/hr (ref 0–22)

## 2015-01-28 LAB — VITAMIN B12: Vitamin B-12: 535 pg/mL (ref 211–911)

## 2015-01-28 MED ORDER — NA SULFATE-K SULFATE-MG SULF 17.5-3.13-1.6 GM/177ML PO SOLN: 1.0000 | Freq: Once | ORAL | Status: DC

## 2015-01-28 NOTE — Progress Notes (Signed)
Cassandra Garcia [birth year] [id number]  Note: This dictation was prepared with Dragon digital system. Any transcriptional errors that result from this procedure are unintentional.   History of Present Illness: This is a 80 year old white female with inflammatory bowel disease, most likely Crohn's disease ,of the small bowel and with chronic GI blood loss. Diagnosis was  made on a  small bowel capsule endoscopy in 2009. This study showed small bowel ulcerations consistent with IBD. She has required iron infusions. Last infusion February 2013. Her hemoglobin last year was 13. Most recent one 11.7. She has been on sulfasalazine 500 mg 2 twice a day. She cannot afford the more expensive medications. She is due for recall colonoscopy. Last colonoscopy in March 2009 showed severe diverticulosis. Patient denies abdominal pain or rectal bleeding. She was hospitalized with sepsis from 12/12/2014 to 12/15/2014.    Past Medical History  Diagnosis Date  . Anemia   . Nephrolithiasis   . Acute asthmatic bronchitis   . Hypertension   . Diverticulosis of colon   . Nausea   . Abdominal pain, left lower quadrant   . Crohn's disease     Past Surgical History  Procedure Laterality Date  . Vaginal hysterectomy    . Cataract extraction Right     Allergies  Allergen Reactions  . Iron Dextran Diarrhea and Nausea And Vomiting    Family history and social history have been reviewed.  Review of Systems:   The remainder of the 10 point ROS is negative except as outlined in the H&P  Physical Exam: General Appearance Well developed, in no distress Eyes  Non icteric  HEENT  Non traumatic, normocephalic  Mouth No lesion, tongue papillated, no cheilosis Neck Supple without adenopathy, thyroid not enlarged, no carotid bruits, no JVD Lungs Clear to auscultation bilaterally COR Normal S1, normal S2, regular rhythm, no murmur, quiet precordium Abdomen soft relaxed. Normoactive bowel sounds. No  distention Rectal yellow Hemoccult-positive stool Extremities  trace pedal edema Skin No lesions Neurological Alert and oriented x 3 Psychological Normal mood and affect  Assessment and Plan:   80 year old white female with recent hospitalization for sepsis. Primary site not determined. She is feeling much better but still has lack of energy. We will be rechecking her blood count, B12, sedimentation rate, and iron levels today  Inflammatory bowel disease of the small bowel causing chronic GI blood loss. Blood count has dropped. She is Hemoccult positive on today's exam. She may need an iron infusion depending on her CBC today. She is to continue on sulfasalazine 1 g twice a day and folic acid 1 mg daily. We will schedule colonoscopy for July 2016    [name] 01/28/2015

## 2015-01-28 NOTE — Patient Instructions (Addendum)
Dr Johnella Moloney  Go to the basement for labs today  You have been scheduled for a colonoscopy. Please follow written instructions given to you at your visit today.  Please pick up your prep supplies at the pharmacy within the next 1-3 days. If you use inhalers (even only as needed), please bring them with you on the day of your procedure. Your physician has requested that you go to www.[email or web address] and enter the access code given to you at your visit today. This web site gives a general overview about your procedure. However, you should still follow specific instructions given to you by our office regarding your preparation for the procedure.

## 2015-02-01 ENCOUNTER — Other Ambulatory Visit: Payer: Self-pay | Admitting: Internal Medicine

## 2015-02-10 ENCOUNTER — Telehealth: Payer: Self-pay | Admitting: Internal Medicine

## 2015-02-10 MED ORDER — SULFASALAZINE 500 MG PO TABS: 500.0000 mg | ORAL_TABLET | Freq: Two times a day (BID) | ORAL | Status: DC

## 2015-02-10 NOTE — Telephone Encounter (Signed)
Sent Rx for sulfasalazine (AZULFIDINE), 500 mg, #30 with no refills to [facility] in [place], [place]. Patient waiting for mail order to come in this Sunday or Monday (02/13/15 or 02/14/15). Gave a week's supply to hold them over before mail order comes in.

## 2015-02-11 ENCOUNTER — Telehealth: Payer: Self-pay | Admitting: Internal Medicine

## 2015-02-11 NOTE — Telephone Encounter (Signed)
Called to clarify that patient is to take 1 gram of Sulfasalazine BID per last dictation. Gave her 1 month supply.

## 2015-02-11 NOTE — Telephone Encounter (Signed)
Will be out of her medicine this Sunday. Wants to get a full month because it will cost her the same $$ to get one week or 30days.

## 2015-03-07 ENCOUNTER — Encounter: Payer: Self-pay | Admitting: Internal Medicine

## 2015-03-08 ENCOUNTER — Telehealth: Payer: Self-pay | Admitting: Internal Medicine

## 2015-03-08 ENCOUNTER — Other Ambulatory Visit: Payer: Self-pay | Admitting: Internal Medicine

## 2015-03-08 MED ORDER — DICYCLOMINE HCL 10 MG PO CAPS: 10.0000 mg | ORAL_CAPSULE | Freq: Two times a day (BID) | ORAL | Status: DC

## 2015-03-08 NOTE — Telephone Encounter (Signed)
Ok'd by Dr Juanda Chance to send

## 2015-03-08 NOTE — Telephone Encounter (Signed)
ERROR

## 2015-03-09 ENCOUNTER — Telehealth: Payer: Self-pay | Admitting: Internal Medicine

## 2015-03-09 MED ORDER — DICYCLOMINE HCL 10 MG PO CAPS: 10.0000 mg | ORAL_CAPSULE | Freq: Two times a day (BID) | ORAL | Status: DC

## 2015-03-09 NOTE — Telephone Encounter (Signed)
Medication was sent in yesterday but sent in to [facility], medication was called in to [facility] for Dicyclomine 10mg  1 bid #60 with 0 refills. Patient informed that medication was called in.

## 2015-04-05 ENCOUNTER — Other Ambulatory Visit: Payer: Self-pay | Admitting: Internal Medicine

## 2015-04-06 ENCOUNTER — Encounter: Payer: Self-pay | Admitting: Gastroenterology

## 2015-04-06 ENCOUNTER — Encounter: Payer: Medicare Other | Admitting: Internal Medicine

## 2015-04-27 ENCOUNTER — Other Ambulatory Visit: Payer: Self-pay | Admitting: Internal Medicine

## 2015-05-04 ENCOUNTER — Other Ambulatory Visit: Payer: Self-pay | Admitting: Internal Medicine

## 2015-06-24 ENCOUNTER — Ambulatory Visit (AMBULATORY_SURGERY_CENTER): Payer: Self-pay

## 2015-06-24 ENCOUNTER — Telehealth: Payer: Self-pay | Admitting: *Deleted

## 2015-06-24 VITALS — Ht 65.0 in | Wt 166.0 lb

## 2015-06-24 DIAGNOSIS — D509 Iron deficiency anemia, unspecified: Secondary | ICD-10-CM

## 2015-06-24 NOTE — Progress Notes (Signed)
No allergies to eggs or soy No home oxygen No past problems with anesthesia No diet/weight loss meds  Refused emmi; no internet

## 2015-06-24 NOTE — Telephone Encounter (Signed)
Dr. Lavon Paganini patient is here today for pre visit and has a scheduled colonoscopy on 07-08-15 with you. She states that she usually gets labs every 3 months her last labs were done on 01-28-15 for a b12, sed rate, CBC w/diff and IBC panel. Is it ok to order labs and if so what would you like her to get drawn?

## 2015-06-27 NOTE — Telephone Encounter (Signed)
Labs placed in Epic and msg left for patient to contact office.

## 2015-06-27 NOTE — Telephone Encounter (Signed)
Please order only CBC , LFT BMP. Thanks

## 2015-06-28 ENCOUNTER — Telehealth: Payer: Self-pay

## 2015-06-28 NOTE — Telephone Encounter (Signed)
This is a former Dr Juanda Chance pt. who is requesting refills on Dicyclomine 10mg  1cap BID. Fax request from [facility] pharmacy. Pt is scheduled for a colonoscopy with you on 07-08-15. Please advise on refills.

## 2015-06-29 NOTE — Telephone Encounter (Signed)
Ok to refill 

## 2015-06-30 MED ORDER — DICYCLOMINE HCL 10 MG PO CAPS: ORAL_CAPSULE | ORAL | Status: DC

## 2015-06-30 NOTE — Telephone Encounter (Signed)
Medication refills sent to [facility] pharmacy.

## 2015-07-01 ENCOUNTER — Other Ambulatory Visit: Payer: Self-pay | Admitting: Emergency Medicine

## 2015-07-01 ENCOUNTER — Other Ambulatory Visit (INDEPENDENT_AMBULATORY_CARE_PROVIDER_SITE_OTHER): Payer: Medicare Other

## 2015-07-01 DIAGNOSIS — D509 Iron deficiency anemia, unspecified: Secondary | ICD-10-CM

## 2015-07-01 LAB — CBC WITH DIFFERENTIAL/PLATELET
BASOS PCT: 0.8 % (ref 0.0–3.0)
Basophils Absolute: 0.1 10*3/uL (ref 0.0–0.1)
EOS PCT: 3.4 % (ref 0.0–5.0)
Eosinophils Absolute: 0.2 10*3/uL (ref 0.0–0.7)
HCT: 30.3 % — ABNORMAL LOW (ref 36.0–46.0)
Hemoglobin: 9.3 g/dL — ABNORMAL LOW (ref 12.0–15.0)
Lymphocytes Relative: 17.8 % (ref 12.0–46.0)
Lymphs Abs: 1.2 10*3/uL (ref 0.7–4.0)
MCHC: 30.7 g/dL (ref 30.0–36.0)
MCV: 76.2 fl — ABNORMAL LOW (ref 78.0–100.0)
MONO ABS: 0.5 10*3/uL (ref 0.1–1.0)
Monocytes Relative: 7.5 % (ref 3.0–12.0)
NEUTROS ABS: 4.8 10*3/uL (ref 1.4–7.7)
NEUTROS PCT: 70.5 % (ref 43.0–77.0)
Platelets: 340 10*3/uL (ref 150.0–400.0)
RBC: 3.98 Mil/uL (ref 3.87–5.11)
RDW: 18.2 % — AB (ref 11.5–15.5)
WBC: 6.9 10*3/uL (ref 4.0–10.5)

## 2015-07-01 LAB — HEPATIC FUNCTION PANEL
ALBUMIN: 3.8 g/dL (ref 3.5–5.2)
ALK PHOS: 82 U/L (ref 39–117)
ALT: 11 U/L (ref 0–35)
AST: 16 U/L (ref 0–37)
BILIRUBIN DIRECT: 0.1 mg/dL (ref 0.0–0.3)
Total Bilirubin: 0.3 mg/dL (ref 0.2–1.2)
Total Protein: 6 g/dL (ref 6.0–8.3)

## 2015-07-01 LAB — BASIC METABOLIC PANEL
BUN: 15 mg/dL (ref 6–23)
CALCIUM: 9.4 mg/dL (ref 8.4–10.5)
CO2: 27 mEq/L (ref 19–32)
Chloride: 104 mEq/L (ref 96–112)
Creatinine, Ser: 0.58 mg/dL (ref 0.40–1.20)
GFR: 106.78 mL/min (ref >60.00)
Glucose, Bld: 126 mg/dL — ABNORMAL HIGH (ref 70–99)
Potassium: 3.8 mEq/L (ref 3.5–5.1)
SODIUM: 140 meq/L (ref 135–145)

## 2015-07-01 MED ORDER — SULFASALAZINE 500 MG PO TABS: 1000.0000 mg | ORAL_TABLET | Freq: Two times a day (BID) | ORAL | Status: DC

## 2015-07-04 ENCOUNTER — Telehealth: Payer: Self-pay

## 2015-07-04 ENCOUNTER — Other Ambulatory Visit: Payer: Self-pay

## 2015-07-04 ENCOUNTER — Other Ambulatory Visit (INDEPENDENT_AMBULATORY_CARE_PROVIDER_SITE_OTHER): Payer: Medicare Other

## 2015-07-04 DIAGNOSIS — D649 Anemia, unspecified: Secondary | ICD-10-CM

## 2015-07-04 DIAGNOSIS — K5000 Crohn's disease of small intestine without complications: Secondary | ICD-10-CM

## 2015-07-04 LAB — FERRITIN: Ferritin: 4.9 ng/mL — ABNORMAL LOW (ref 10.0–291.0)

## 2015-07-04 LAB — FOLATE: Folate: >23.8 ng/mL (ref >5.9)

## 2015-07-04 LAB — VITAMIN B12: Vitamin B-12: 439 pg/mL (ref 211–911)

## 2015-07-04 LAB — IRON: IRON: 19 ug/dL — AB (ref 42–145)

## 2015-07-04 NOTE — Telephone Encounter (Signed)
Fax request for Sulfasalazine 500mg  2 tabs BID from [facility] pharmacy.  Pt is scheduled for a colonoscopy for 07-08-15 with you. Please advise.

## 2015-07-05 MED ORDER — SULFASALAZINE 500 MG PO TABS: 1000.0000 mg | ORAL_TABLET | Freq: Two times a day (BID) | ORAL | Status: DC

## 2015-07-05 NOTE — Telephone Encounter (Signed)
Medication has been filled with [facility] pharmacy.

## 2015-07-05 NOTE — Telephone Encounter (Signed)
Ok to refill. Thanks 

## 2015-07-06 ENCOUNTER — Encounter: Payer: Self-pay | Admitting: Gastroenterology

## 2015-07-06 ENCOUNTER — Other Ambulatory Visit: Payer: Self-pay

## 2015-07-08 ENCOUNTER — Encounter: Payer: Self-pay | Admitting: Gastroenterology

## 2015-07-08 ENCOUNTER — Telehealth: Payer: Self-pay | Admitting: Gastroenterology

## 2015-07-08 ENCOUNTER — Ambulatory Visit: Payer: Medicare Other | Admitting: Gastroenterology

## 2015-07-08 ENCOUNTER — Other Ambulatory Visit: Payer: Self-pay

## 2015-07-08 DIAGNOSIS — D62 Acute posthemorrhagic anemia: Secondary | ICD-10-CM

## 2015-07-08 MED ORDER — SODIUM CHLORIDE 0.9 % IV SOLN: 500.0000 mL | INTRAVENOUS | Status: DC

## 2015-07-08 MED ORDER — SODIUM CHLORIDE 0.9 % IV SOLN: 25.0000 mg | Freq: Once | INTRAVENOUS | Status: AC

## 2015-07-08 MED ORDER — SODIUM CHLORIDE 0.9 % IV SOLN: 125.0000 mg | Freq: Once | INTRAVENOUS | Status: AC

## 2015-07-08 NOTE — Telephone Encounter (Signed)
ok 

## 2015-07-08 NOTE — Telephone Encounter (Signed)
Spoke to pt she states that she called yesterday all day to let us know that she wasn't feeling well. She states that shes been sick vomiting and that her iron is low since Wednesday. There isn't a note anywhere that shows that the pt spoke to anyone about this. She says she cannot come in. Would you like to charge?

## 2015-07-11 ENCOUNTER — Emergency Department (HOSPITAL_COMMUNITY)
Admission: EM | Admit: 2015-07-11 | Discharge: 2015-07-11 | Disposition: A | Discharge status: Home / Self Care | Payer: Medicare Other | Attending: Emergency Medicine | Admitting: Emergency Medicine

## 2015-07-11 ENCOUNTER — Encounter (HOSPITAL_COMMUNITY): Payer: Self-pay | Admitting: *Deleted

## 2015-07-11 DIAGNOSIS — R531 Weakness: Secondary | ICD-10-CM | POA: Diagnosis present

## 2015-07-11 DIAGNOSIS — Z87442 Personal history of urinary calculi: Secondary | ICD-10-CM | POA: Insufficient documentation

## 2015-07-11 DIAGNOSIS — Z8719 Personal history of other diseases of the digestive system: Secondary | ICD-10-CM | POA: Diagnosis not present

## 2015-07-11 DIAGNOSIS — J45909 Unspecified asthma, uncomplicated: Secondary | ICD-10-CM | POA: Diagnosis not present

## 2015-07-11 DIAGNOSIS — I10 Essential (primary) hypertension: Secondary | ICD-10-CM | POA: Insufficient documentation

## 2015-07-11 DIAGNOSIS — Z7982 Long term (current) use of aspirin: Secondary | ICD-10-CM | POA: Insufficient documentation

## 2015-07-11 DIAGNOSIS — D649 Anemia, unspecified: Secondary | ICD-10-CM | POA: Diagnosis not present

## 2015-07-11 DIAGNOSIS — Z79899 Other long term (current) drug therapy: Secondary | ICD-10-CM | POA: Diagnosis not present

## 2015-07-11 LAB — BASIC METABOLIC PANEL
Anion gap: 8 (ref 5–15)
BUN: 10 mg/dL (ref 6–20)
CHLORIDE: 106 mmol/L (ref 101–111)
CO2: 25 mmol/L (ref 22–32)
Calcium: 9.3 mg/dL (ref 8.9–10.3)
Creatinine, Ser: 0.48 mg/dL (ref 0.44–1.00)
GFR calc non Af Amer: >60 mL/min (ref >60)
Glucose, Bld: 99 mg/dL (ref 65–99)
POTASSIUM: 3.3 mmol/L — AB (ref 3.5–5.1)
SODIUM: 139 mmol/L (ref 135–145)

## 2015-07-11 LAB — POC OCCULT BLOOD, ED: FECAL OCCULT BLD: NEGATIVE

## 2015-07-11 LAB — CBC WITH DIFFERENTIAL/PLATELET
Basophils Absolute: 0.1 10*3/uL (ref 0.0–0.1)
Basophils Relative: 1 %
Eosinophils Absolute: 0.1 10*3/uL (ref 0.0–0.7)
Eosinophils Relative: 2 %
HCT: 33.6 % — ABNORMAL LOW (ref 36.0–46.0)
HEMOGLOBIN: 9.7 g/dL — AB (ref 12.0–15.0)
LYMPHS ABS: 1.2 10*3/uL (ref 0.7–4.0)
LYMPHS PCT: 20 %
MCH: 22.9 pg — AB (ref 26.0–34.0)
MCHC: 28.9 g/dL — ABNORMAL LOW (ref 30.0–36.0)
MCV: 79.4 fL (ref 78.0–100.0)
Monocytes Absolute: 0.5 10*3/uL (ref 0.1–1.0)
Monocytes Relative: 9 %
NEUTROS PCT: 68 %
Neutro Abs: 4.1 10*3/uL (ref 1.7–7.7)
Platelets: 278 10*3/uL (ref 150–400)
RBC: 4.23 MIL/uL (ref 3.87–5.11)
RDW: 16.8 % — ABNORMAL HIGH (ref 11.5–15.5)
WBC: 6 10*3/uL (ref 4.0–10.5)

## 2015-07-11 MED ORDER — SODIUM CHLORIDE 0.9 % IV BOLUS (SEPSIS): 1000.0000 mL | Freq: Once | INTRAVENOUS | Status: DC

## 2015-07-11 MED ORDER — ONDANSETRON HCL 4 MG/2ML IJ SOLN: 4.0000 mg | Freq: Once | INTRAMUSCULAR | Status: DC

## 2015-07-11 NOTE — ED Notes (Signed)
Pt reports hx of anemia, for 1 weeks has had leg cramps, weakness, intermittent vomiting. Denies pain. Had blood work drawn at [facility] and was told her iron level is low and she needs an infusion.

## 2015-07-11 NOTE — ED Notes (Addendum)
2:56 PM : Resting comfortably. Tolerating lunch meal with Sprite without difficulty. Very pleasant  3:22 PM : Pt drank additional 580 ml PO (water and sprite) and requesting more water. Denies nausea at this time. Will clarify orders for IVF / Zofran

## 2015-07-11 NOTE — ED Provider Notes (Signed)
CSN: [id number]     Arrival date & time 07/11/15  1156 History   First MD Initiated Contact with Patient 07/11/15 1504     Chief Complaint  Patient presents with  . Low Iron Level, Weakness, Leg Cramps, Vomiting      (Consider location/radiation/quality/duration/timing/severity/associated sxs/prior Treatment) HPI   Cassandra Garcia is a 80 y.o. female, pt with history of anemia,  and crohns disease, presenting with weakness and fatigue for about one day. Pt was seen by [facility] 4 days ago and was told she would need some iron. Was scheduled for [facility] iron infusion on Nov 9, but when she was reevaluated yesterday by GI, was told that she would need to come into the ED to receive an iron infusion. Pt sees Dr. Lavon Paganini.  Pt denies any active bleeding, hematochezia, fever/chills, current N/V, abnormal diarrhea, or any other complaints. Pt states her last iron infusion was about 4 years ago. Pt adds that she gets blood work done every 3 months. Was scheduled for a colonoscopy on 10/28, but did not take the prep, stating she was too weak.    Past Medical History  Diagnosis Date  . Anemia   . Nephrolithiasis   . Acute asthmatic bronchitis   . Hypertension   . Diverticulosis of colon   . Nausea   . Abdominal pain, left lower quadrant   . Crohn's disease [facility])    Past Surgical History  Procedure Laterality Date  . Vaginal hysterectomy    . Cataract extraction Right    Family History  Problem Relation Age of Onset  . Esophageal cancer Father   . Liver cancer Brother   . Leukemia Brother   . Colon cancer Neg Hx   . Rectal cancer Neg Hx   . Stomach cancer Neg Hx    Social History  Substance Use Topics  . Smoking status: Never Smoker   . Smokeless tobacco: Never Used  . Alcohol Use: None   OB History    No data available     Review of Systems  Constitutional: Negative for fever, chills, diaphoresis and unexpected weight change.  Respiratory: Negative for cough,  chest tightness and shortness of breath.   Cardiovascular: Negative for chest pain, palpitations and leg swelling.  Gastrointestinal: Negative for nausea, vomiting, abdominal pain, diarrhea and constipation.  Genitourinary: Negative for dysuria and flank pain.  Musculoskeletal: Negative for back pain.  Skin: Negative for color change and pallor.  Neurological: Positive for weakness. Negative for dizziness, syncope and light-headedness.  All other systems reviewed and are negative.     Allergies  Iron dextran  Home Medications   Prior to Admission medications   Medication Sig Start Date End Date Taking? Authorizing Provider  acetaminophen (TYLENOL) 500 MG tablet Take 500 mg by mouth as needed for mild pain or headache.    Yes Historical Provider, MD  albuterol (PROVENTIL HFA;VENTOLIN HFA) 108 (90 BASE) MCG/ACT inhaler Inhale 1 puff into the lungs every 6 (six) hours as needed (For asthma.).    Yes Historical Provider, MD  aspirin EC 81 MG tablet Take 81 mg by mouth daily.   Yes Historical Provider, MD  dicyclomine (BENTYL) 10 MG capsule TAKE 1 CAPSULE TWICE DAILY Patient taking differently: Take 10 mg by mouth 2 (two) times daily.  06/30/15  Yes Napoleon Form, MD  dimenhyDRINATE (DRAMAMINE) 50 MG tablet Take 50 mg by mouth every 8 (eight) hours as needed for nausea.   Yes Historical Provider, MD  folic  acid (FOLVITE) 1 MG tablet TAKE 1 TABLET EVERY DAY 05/04/15  Yes Hart Carwin, MD  furosemide (LASIX) 20 MG tablet Take 20 mg by mouth daily.     Yes Historical Provider, MD  lisinopril (PRINIVIL,ZESTRIL) 10 MG tablet Take 10 mg by mouth daily.     Yes Historical Provider, MD  metoprolol succinate (TOPROL-XL) 25 MG 24 hr tablet Take 25 mg by mouth daily.   Yes Historical Provider, MD  simvastatin (ZOCOR) 20 MG tablet Take 20 mg by mouth at bedtime.     Yes Historical Provider, MD  sulfaSALAzine (AZULFIDINE) 500 MG tablet Take 2 tablets (1,000 mg total) by mouth 2 (two) times daily.  07/05/15  Yes Napoleon Form, MD  ondansetron (ZOFRAN) 4 MG tablet Take 1 tablet (4 mg total) by mouth every 6 (six) hours as needed for nausea or vomiting. Patient not taking: Reported on 07/11/2015 12/15/14   Calvert Cantor, MD   BP 139/61 mmHg  Pulse 87  Temp(Src) 98 F (36.7 C) (Oral)  Resp 20  SpO2 96% Physical Exam  Constitutional: She appears well-developed and well-nourished. No distress.  HENT:  Head: Normocephalic and atraumatic.  Eyes: Conjunctivae are normal. Pupils are equal, round, and reactive to light.  Cardiovascular: Normal rate, regular rhythm and normal heart sounds.   Pulmonary/Chest: Effort normal and breath sounds normal. No respiratory distress.  Abdominal: Soft. Bowel sounds are normal.  Genitourinary: Rectum normal. Rectal exam shows no external hemorrhoid and no internal hemorrhoid. Guaiac negative stool.  RN present during rectal exam. No abnormalities found.  Musculoskeletal: She exhibits no edema or tenderness.  Neurological: She is alert.  Skin: Skin is warm and dry. She is not diaphoretic.  Nursing note and vitals reviewed.   ED Course  Procedures (including critical care time) Labs Review Labs Reviewed  BASIC METABOLIC PANEL - Abnormal; Notable for the following:    Potassium 3.3 (*)    All other components within normal limits  CBC WITH DIFFERENTIAL/PLATELET - Abnormal; Notable for the following:    Hemoglobin 9.7 (*)    HCT 33.6 (*)    MCH 22.9 (*)    MCHC 28.9 (*)    RDW 16.8 (*)    All other components within normal limits  POC OCCULT BLOOD, ED    Imaging Review No results found. I have personally reviewed and evaluated these images and lab results as part of my medical decision-making.   EKG Interpretation None      MDM   Final diagnoses:  Anemia, unspecified anemia type    Cassandra Garcia presents with weakness, fatigue, and anemia.  Findings and plan of care discussed with Cassandra Berkshire, MD.  Call GI specialist, ask  what they had planned for this pt, that we don't usually do iron here in the ED and pt may have be admitted. Called [facility] to try to speak with Dr. Lavon Paganini, but she is unavailable and operator advised me to page the GI pager. GI pager [phone]. 4:01 PM Verdie Mosher from [facility]. States they understand we don't give iron infusions in ED. Apparently pt came into [facility] while everyone was at lunch, told the secretary she was dizzy and weak and was told to go to ED. States pt Hgb has always been around 9. States pt can be discharged if otherwise medically cleared and they will call her tomorrow to set up a sooner iron infusion appt.  Pt currently has no dizziness/lightheadedness, chest pain, shortness of breath, or any  other complaints other than weakness, which GI says is to be expected. Plan of care explained to pt, who agreed to plan of care. Pt confirms she has a ride home. Hemocult negative. Pt then advised that she talked to her nephew, who is a physician in [place], and he advised her to not leave the ED. Pt states she is ok with going home. Advised pt to return to ED should she feel worse and if GI doesn't call her by tomorrow morning, to call them tomorrow afternoon.  Anselm Pancoast, PA-C 07/11/15 1743  Anselm Pancoast, PA-C 07/11/15 1833  Cassandra Berkshire, MD 07/11/15 [phone]

## 2015-07-11 NOTE — Discharge Instructions (Signed)
You have been seen today for anemia and an iron infusion. These are not performed in the ED. [facility] states they will contact you tomorrow to set up a sooner appointment for iron infusion. Follow up with PCP as needed. Return to ED should symptoms worsen.

## 2015-07-12 DIAGNOSIS — R110 Nausea: Secondary | ICD-10-CM | POA: Diagnosis not present

## 2015-07-12 DIAGNOSIS — E876 Hypokalemia: Secondary | ICD-10-CM | POA: Diagnosis not present

## 2015-07-12 DIAGNOSIS — D509 Iron deficiency anemia, unspecified: Secondary | ICD-10-CM | POA: Diagnosis not present

## 2015-07-18 ENCOUNTER — Other Ambulatory Visit: Payer: Self-pay

## 2015-07-18 DIAGNOSIS — E876 Hypokalemia: Secondary | ICD-10-CM | POA: Diagnosis not present

## 2015-07-18 DIAGNOSIS — D509 Iron deficiency anemia, unspecified: Secondary | ICD-10-CM | POA: Diagnosis not present

## 2015-07-20 ENCOUNTER — Encounter (HOSPITAL_COMMUNITY): Admission: RE | Admit: 2015-07-20 | Payer: Medicare Other | Source: Ambulatory Visit

## 2015-07-21 ENCOUNTER — Telehealth: Payer: Self-pay

## 2015-07-21 NOTE — Telephone Encounter (Signed)
-----   Message from Napoleon Form, MD sent at 07/20/2015  3:20 PM EST ----- Regarding: RE: Canceled iron infusion Can we please obtain those records, to make sure its really normal. Thanks VN ----- Message -----    From: Evalee Jefferson, LPN    Sent: [id number]/10/6331   8:57 AM      To: Napoleon Form, MD Subject: FW: Canceled iron infusion                     See below ----- Message -----    From: Sim Boast, RN    Sent: 07/20/2015   7:56 AM      To: Katherene Ponto, NT, Evalee Jefferson, LPN Subject: Canceled iron infusion                         Beth,  just wanted to let you know that this patient called this morning and said she "went to see Dr Kevan Ny , he had put me on a potassium pill and iron pills and drew some blood work. He said my iron levels were good so I didn't need an iron infusion" Just wanted to let you know this patient canceled her appointment Marylu Lund

## 2015-07-21 NOTE — Telephone Encounter (Signed)
Office note and recent labs received. Given to Dr Lavon Paganini for review.

## 2015-07-21 NOTE — Telephone Encounter (Signed)
Message left for the nurse of Dr Johnella Moloney. She calls back and will fax over a copy of the patient's labs.

## 2015-08-09 DIAGNOSIS — I10 Essential (primary) hypertension: Secondary | ICD-10-CM | POA: Diagnosis not present

## 2015-08-09 DIAGNOSIS — Z23 Encounter for immunization: Secondary | ICD-10-CM | POA: Diagnosis not present

## 2015-08-09 DIAGNOSIS — D509 Iron deficiency anemia, unspecified: Secondary | ICD-10-CM | POA: Diagnosis not present

## 2015-08-09 DIAGNOSIS — R110 Nausea: Secondary | ICD-10-CM | POA: Diagnosis not present

## 2015-08-09 DIAGNOSIS — E876 Hypokalemia: Secondary | ICD-10-CM | POA: Diagnosis not present

## 2015-08-09 DIAGNOSIS — K5090 Crohn's disease, unspecified, without complications: Secondary | ICD-10-CM | POA: Diagnosis not present

## 2015-08-09 DIAGNOSIS — R000 Tachycardia, unspecified: Secondary | ICD-10-CM | POA: Diagnosis not present

## 2015-08-09 DIAGNOSIS — L049 Acute lymphadenitis, unspecified: Secondary | ICD-10-CM | POA: Diagnosis not present

## 2015-08-29 ENCOUNTER — Other Ambulatory Visit: Payer: Self-pay | Admitting: Gastroenterology

## 2015-09-05 ENCOUNTER — Other Ambulatory Visit: Payer: Self-pay | Admitting: Gastroenterology

## 2015-09-23 DIAGNOSIS — R110 Nausea: Secondary | ICD-10-CM | POA: Diagnosis not present

## 2015-09-23 DIAGNOSIS — K5090 Crohn's disease, unspecified, without complications: Secondary | ICD-10-CM | POA: Diagnosis not present

## 2015-09-23 DIAGNOSIS — I10 Essential (primary) hypertension: Secondary | ICD-10-CM | POA: Diagnosis not present

## 2015-09-23 DIAGNOSIS — L049 Acute lymphadenitis, unspecified: Secondary | ICD-10-CM | POA: Diagnosis not present

## 2015-09-23 DIAGNOSIS — E876 Hypokalemia: Secondary | ICD-10-CM | POA: Diagnosis not present

## 2015-09-23 DIAGNOSIS — R000 Tachycardia, unspecified: Secondary | ICD-10-CM | POA: Diagnosis not present

## 2015-09-23 DIAGNOSIS — D509 Iron deficiency anemia, unspecified: Secondary | ICD-10-CM | POA: Diagnosis not present

## 2015-10-25 DIAGNOSIS — L309 Dermatitis, unspecified: Secondary | ICD-10-CM | POA: Diagnosis not present

## 2015-10-25 DIAGNOSIS — R05 Cough: Secondary | ICD-10-CM | POA: Diagnosis not present

## 2015-10-25 DIAGNOSIS — J209 Acute bronchitis, unspecified: Secondary | ICD-10-CM | POA: Diagnosis not present

## 2015-10-31 DIAGNOSIS — B029 Zoster without complications: Secondary | ICD-10-CM | POA: Diagnosis not present

## 2015-12-16 NOTE — Telephone Encounter (Signed)
Patient has [employer]. Unable to [employer].

## 2016-01-12 DIAGNOSIS — D500 Iron deficiency anemia secondary to blood loss (chronic): Secondary | ICD-10-CM | POA: Diagnosis not present

## 2016-03-29 DIAGNOSIS — L309 Dermatitis, unspecified: Secondary | ICD-10-CM | POA: Diagnosis not present

## 2016-03-29 DIAGNOSIS — E559 Vitamin D deficiency, unspecified: Secondary | ICD-10-CM | POA: Diagnosis not present

## 2016-03-29 DIAGNOSIS — E876 Hypokalemia: Secondary | ICD-10-CM | POA: Diagnosis not present

## 2016-03-29 DIAGNOSIS — Z1389 Encounter for screening for other disorder: Secondary | ICD-10-CM | POA: Diagnosis not present

## 2016-03-29 DIAGNOSIS — R110 Nausea: Secondary | ICD-10-CM | POA: Diagnosis not present

## 2016-03-29 DIAGNOSIS — Z79899 Other long term (current) drug therapy: Secondary | ICD-10-CM | POA: Diagnosis not present

## 2016-03-29 DIAGNOSIS — K5090 Crohn's disease, unspecified, without complications: Secondary | ICD-10-CM | POA: Diagnosis not present

## 2016-03-29 DIAGNOSIS — I10 Essential (primary) hypertension: Secondary | ICD-10-CM | POA: Diagnosis not present

## 2016-03-29 DIAGNOSIS — D500 Iron deficiency anemia secondary to blood loss (chronic): Secondary | ICD-10-CM | POA: Diagnosis not present

## 2016-03-29 DIAGNOSIS — Z0001 Encounter for general adult medical examination with abnormal findings: Secondary | ICD-10-CM | POA: Diagnosis not present

## 2016-03-29 DIAGNOSIS — R000 Tachycardia, unspecified: Secondary | ICD-10-CM | POA: Diagnosis not present

## 2016-04-26 DIAGNOSIS — D500 Iron deficiency anemia secondary to blood loss (chronic): Secondary | ICD-10-CM | POA: Diagnosis not present

## 2016-05-02 ENCOUNTER — Ambulatory Visit
Admission: RE | Admit: 2016-05-02 | Discharge: 2016-05-02 | Disposition: A | Discharge status: Home / Self Care | Payer: Medicare Other | Source: Ambulatory Visit | Attending: Internal Medicine | Admitting: Internal Medicine

## 2016-05-02 ENCOUNTER — Other Ambulatory Visit: Payer: Self-pay | Admitting: Internal Medicine

## 2016-05-02 DIAGNOSIS — Z8719 Personal history of other diseases of the digestive system: Secondary | ICD-10-CM | POA: Diagnosis not present

## 2016-05-02 DIAGNOSIS — K529 Noninfective gastroenteritis and colitis, unspecified: Secondary | ICD-10-CM

## 2016-05-02 DIAGNOSIS — R112 Nausea with vomiting, unspecified: Secondary | ICD-10-CM | POA: Diagnosis not present

## 2016-05-02 DIAGNOSIS — R05 Cough: Secondary | ICD-10-CM | POA: Diagnosis not present

## 2016-05-02 DIAGNOSIS — R0602 Shortness of breath: Secondary | ICD-10-CM | POA: Diagnosis not present

## 2016-05-03 ENCOUNTER — Other Ambulatory Visit: Payer: Self-pay | Admitting: Internal Medicine

## 2016-05-03 DIAGNOSIS — Z8719 Personal history of other diseases of the digestive system: Secondary | ICD-10-CM

## 2016-05-03 DIAGNOSIS — K2970 Gastritis, unspecified, without bleeding: Secondary | ICD-10-CM

## 2016-05-15 ENCOUNTER — Ambulatory Visit
Admission: RE | Admit: 2016-05-15 | Discharge: 2016-05-15 | Disposition: A | Discharge status: Home / Self Care | Payer: Medicare Other | Source: Ambulatory Visit | Attending: Internal Medicine | Admitting: Internal Medicine

## 2016-05-15 DIAGNOSIS — K2970 Gastritis, unspecified, without bleeding: Secondary | ICD-10-CM

## 2016-05-15 DIAGNOSIS — Z8719 Personal history of other diseases of the digestive system: Secondary | ICD-10-CM

## 2016-05-15 MED ORDER — IOPAMIDOL (ISOVUE-300) INJECTION 61%
100.0000 mL | Freq: Once | INTRAVENOUS | Status: AC | PRN
  Administered 2016-05-15: 100 mL via INTRAVENOUS

## 2016-05-24 DIAGNOSIS — Z23 Encounter for immunization: Secondary | ICD-10-CM | POA: Diagnosis not present

## 2016-06-28 DIAGNOSIS — L0232 Furuncle of buttock: Secondary | ICD-10-CM | POA: Diagnosis not present

## 2016-06-28 DIAGNOSIS — R112 Nausea with vomiting, unspecified: Secondary | ICD-10-CM | POA: Diagnosis not present

## 2016-08-21 DIAGNOSIS — J209 Acute bronchitis, unspecified: Secondary | ICD-10-CM | POA: Diagnosis not present

## 2016-08-29 DIAGNOSIS — D500 Iron deficiency anemia secondary to blood loss (chronic): Secondary | ICD-10-CM | POA: Diagnosis not present

## 2017-04-08 DIAGNOSIS — Z1389 Encounter for screening for other disorder: Secondary | ICD-10-CM | POA: Diagnosis not present

## 2017-04-08 DIAGNOSIS — E559 Vitamin D deficiency, unspecified: Secondary | ICD-10-CM | POA: Diagnosis not present

## 2017-04-08 DIAGNOSIS — I10 Essential (primary) hypertension: Secondary | ICD-10-CM | POA: Diagnosis not present

## 2017-04-08 DIAGNOSIS — Z79899 Other long term (current) drug therapy: Secondary | ICD-10-CM | POA: Diagnosis not present

## 2017-04-08 DIAGNOSIS — E669 Obesity, unspecified: Secondary | ICD-10-CM | POA: Diagnosis not present

## 2017-04-08 DIAGNOSIS — Z8719 Personal history of other diseases of the digestive system: Secondary | ICD-10-CM | POA: Diagnosis not present

## 2017-04-08 DIAGNOSIS — D500 Iron deficiency anemia secondary to blood loss (chronic): Secondary | ICD-10-CM | POA: Diagnosis not present

## 2017-04-08 DIAGNOSIS — Z6826 Body mass index (BMI) 26.0-26.9, adult: Secondary | ICD-10-CM | POA: Diagnosis not present

## 2017-04-08 DIAGNOSIS — E785 Hyperlipidemia, unspecified: Secondary | ICD-10-CM | POA: Diagnosis not present

## 2017-04-08 DIAGNOSIS — J45909 Unspecified asthma, uncomplicated: Secondary | ICD-10-CM | POA: Diagnosis not present

## 2017-04-08 DIAGNOSIS — Z0000 Encounter for general adult medical examination without abnormal findings: Secondary | ICD-10-CM | POA: Diagnosis not present

## 2017-05-10 DIAGNOSIS — L0232 Furuncle of buttock: Secondary | ICD-10-CM | POA: Diagnosis not present

## 2017-07-13 DIAGNOSIS — Z23 Encounter for immunization: Secondary | ICD-10-CM | POA: Diagnosis not present

## 2017-09-20 DIAGNOSIS — L0292 Furuncle, unspecified: Secondary | ICD-10-CM | POA: Diagnosis not present

## 2017-11-04 DIAGNOSIS — K219 Gastro-esophageal reflux disease without esophagitis: Secondary | ICD-10-CM | POA: Diagnosis not present

## 2017-11-04 DIAGNOSIS — I10 Essential (primary) hypertension: Secondary | ICD-10-CM | POA: Diagnosis not present

## 2017-11-04 DIAGNOSIS — R079 Chest pain, unspecified: Secondary | ICD-10-CM | POA: Diagnosis not present

## 2017-11-06 ENCOUNTER — Other Ambulatory Visit: Payer: Self-pay | Admitting: Internal Medicine

## 2017-11-06 ENCOUNTER — Telehealth (HOSPITAL_COMMUNITY): Payer: Self-pay | Admitting: Internal Medicine

## 2017-11-06 DIAGNOSIS — R079 Chest pain, unspecified: Secondary | ICD-10-CM

## 2017-11-06 DIAGNOSIS — I491 Atrial premature depolarization: Secondary | ICD-10-CM | POA: Diagnosis not present

## 2017-11-06 DIAGNOSIS — R002 Palpitations: Secondary | ICD-10-CM | POA: Diagnosis not present

## 2017-11-07 NOTE — Telephone Encounter (Signed)
User: Trina Ao A Date/time: 11/06/17 2:25 PM  Comment: Called pt and lmsg for her to CB to get sch for ETT.Edmonia Caprio  Context:  Outcome: Left Message  Phone number: [phone] Phone Type: Home Phone  Comm. type: Telephone Call type: Outgoing  Contact: [name] Relation to patient: Self

## 2017-11-14 ENCOUNTER — Ambulatory Visit (INDEPENDENT_AMBULATORY_CARE_PROVIDER_SITE_OTHER): Payer: Medicare Other

## 2017-11-14 DIAGNOSIS — R079 Chest pain, unspecified: Secondary | ICD-10-CM

## 2017-11-14 LAB — EXERCISE TOLERANCE TEST
Estimated workload: 4.6 METS
Exercise duration (min): 2 min
Exercise duration (sec): 0 s
MPHR: 140 {beats}/min
Peak HR: 148 {beats}/min
Percent HR: 105 %
RPE: 14
Rest HR: 84 {beats}/min

## 2018-01-25 DIAGNOSIS — J069 Acute upper respiratory infection, unspecified: Secondary | ICD-10-CM | POA: Diagnosis not present

## 2018-02-01 DIAGNOSIS — J0100 Acute maxillary sinusitis, unspecified: Secondary | ICD-10-CM | POA: Diagnosis not present

## 2018-04-10 DIAGNOSIS — D500 Iron deficiency anemia secondary to blood loss (chronic): Secondary | ICD-10-CM | POA: Diagnosis not present

## 2018-04-10 DIAGNOSIS — Z13820 Encounter for screening for osteoporosis: Secondary | ICD-10-CM | POA: Diagnosis not present

## 2018-04-10 DIAGNOSIS — Z8719 Personal history of other diseases of the digestive system: Secondary | ICD-10-CM | POA: Diagnosis not present

## 2018-04-10 DIAGNOSIS — I10 Essential (primary) hypertension: Secondary | ICD-10-CM | POA: Diagnosis not present

## 2018-04-10 DIAGNOSIS — E669 Obesity, unspecified: Secondary | ICD-10-CM | POA: Diagnosis not present

## 2018-04-10 DIAGNOSIS — E559 Vitamin D deficiency, unspecified: Secondary | ICD-10-CM | POA: Diagnosis not present

## 2018-04-10 DIAGNOSIS — Z1389 Encounter for screening for other disorder: Secondary | ICD-10-CM | POA: Diagnosis not present

## 2018-04-10 DIAGNOSIS — Z0000 Encounter for general adult medical examination without abnormal findings: Secondary | ICD-10-CM | POA: Diagnosis not present

## 2018-04-10 DIAGNOSIS — Z79899 Other long term (current) drug therapy: Secondary | ICD-10-CM | POA: Diagnosis not present

## 2018-04-10 DIAGNOSIS — E785 Hyperlipidemia, unspecified: Secondary | ICD-10-CM | POA: Diagnosis not present

## 2018-04-10 DIAGNOSIS — Z6827 Body mass index (BMI) 27.0-27.9, adult: Secondary | ICD-10-CM | POA: Diagnosis not present

## 2018-04-10 DIAGNOSIS — R079 Chest pain, unspecified: Secondary | ICD-10-CM | POA: Diagnosis not present

## 2018-04-10 DIAGNOSIS — J45909 Unspecified asthma, uncomplicated: Secondary | ICD-10-CM | POA: Diagnosis not present

## 2018-04-30 DIAGNOSIS — L03115 Cellulitis of right lower limb: Secondary | ICD-10-CM | POA: Diagnosis not present

## 2018-05-01 DIAGNOSIS — L02415 Cutaneous abscess of right lower limb: Secondary | ICD-10-CM | POA: Diagnosis not present

## 2018-05-01 DIAGNOSIS — L03115 Cellulitis of right lower limb: Secondary | ICD-10-CM | POA: Diagnosis not present

## 2018-05-03 DIAGNOSIS — H6122 Impacted cerumen, left ear: Secondary | ICD-10-CM | POA: Diagnosis not present

## 2018-05-03 DIAGNOSIS — L03115 Cellulitis of right lower limb: Secondary | ICD-10-CM | POA: Diagnosis not present

## 2018-05-03 DIAGNOSIS — L02415 Cutaneous abscess of right lower limb: Secondary | ICD-10-CM | POA: Diagnosis not present

## 2018-05-05 DIAGNOSIS — L03115 Cellulitis of right lower limb: Secondary | ICD-10-CM | POA: Diagnosis not present

## 2018-05-05 DIAGNOSIS — L02415 Cutaneous abscess of right lower limb: Secondary | ICD-10-CM | POA: Diagnosis not present

## 2018-05-05 DIAGNOSIS — T887XXA Unspecified adverse effect of drug or medicament, initial encounter: Secondary | ICD-10-CM | POA: Diagnosis not present

## 2018-05-05 DIAGNOSIS — R110 Nausea: Secondary | ICD-10-CM | POA: Diagnosis not present

## 2018-05-07 DIAGNOSIS — M8588 Other specified disorders of bone density and structure, other site: Secondary | ICD-10-CM | POA: Diagnosis not present

## 2018-05-07 DIAGNOSIS — Z780 Asymptomatic menopausal state: Secondary | ICD-10-CM | POA: Diagnosis not present

## 2018-05-26 DIAGNOSIS — Z23 Encounter for immunization: Secondary | ICD-10-CM | POA: Diagnosis not present

## 2018-05-27 DIAGNOSIS — M818 Other osteoporosis without current pathological fracture: Secondary | ICD-10-CM | POA: Diagnosis not present

## 2018-06-05 DIAGNOSIS — K5000 Crohn's disease of small intestine without complications: Secondary | ICD-10-CM | POA: Diagnosis not present

## 2018-07-17 IMAGING — CT CT ABD-PELV W/ CM
1 of 3 series · 14 of 32 positions shown, 19 images · IV contrast (APPLIED)
Comparison: 12/04/2007

CLINICAL DATA: Diarrhea and gastritis

EXAM:
CT ABDOMEN AND PELVIS WITH CONTRAST
TECHNIQUE: Multidetector CT imaging of the abdomen and pelvis was performed
using the standard protocol following bolus administration of
intravenous contrast.
CONTRAST:  100mL VW9YW2-0PP IOPAMIDOL (VW9YW2-0PP) INJECTION 61%

[Series 3: abd/pelvis w/cm · axial · 0.74mm/px · z∈[-380,-30]mm · 14 of 78 slices shown, 19 images]
[im 4/78  soft-tissue]
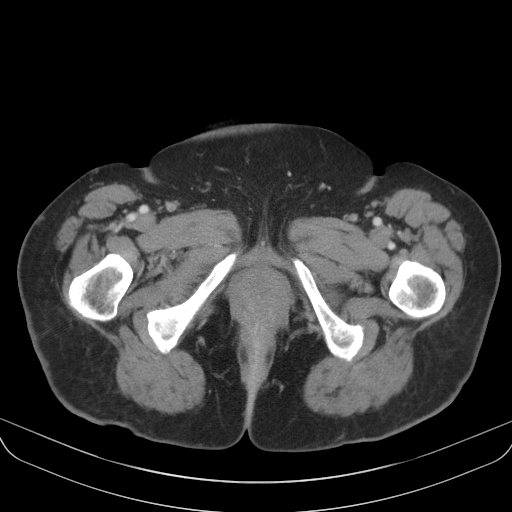
[im 4/78  bone]
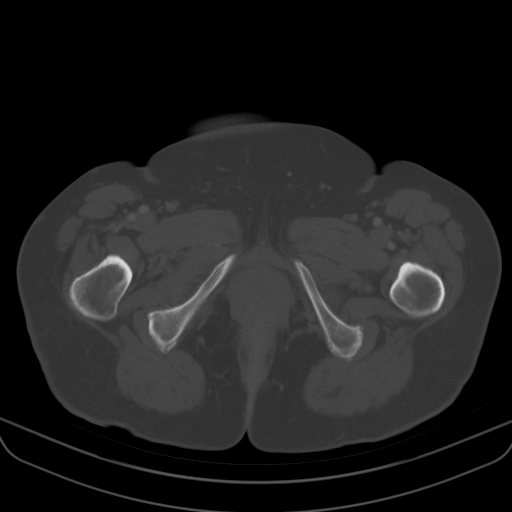
[im 12/78  soft-tissue]
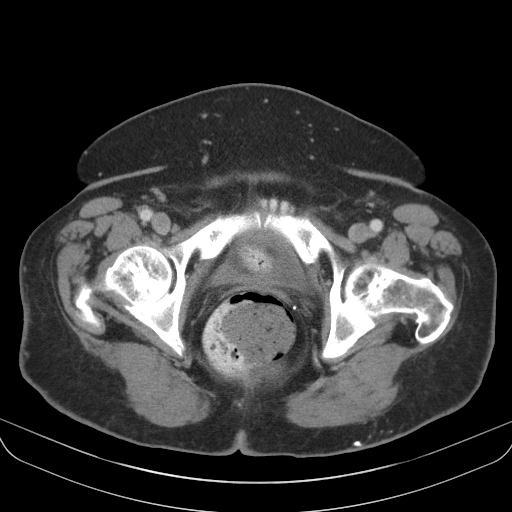
[im 16/78  soft-tissue]
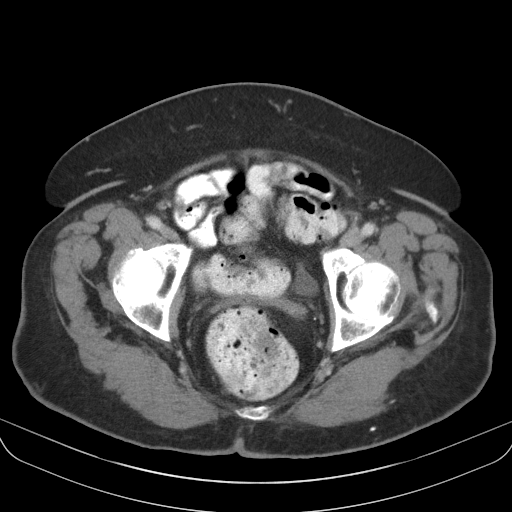
[im 24/78  soft-tissue]
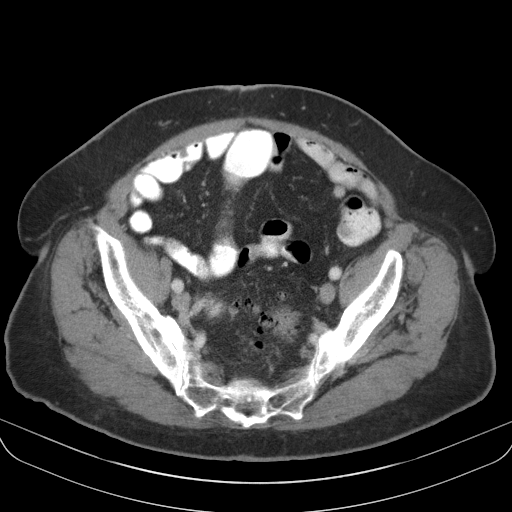
[im 27/78  soft-tissue]
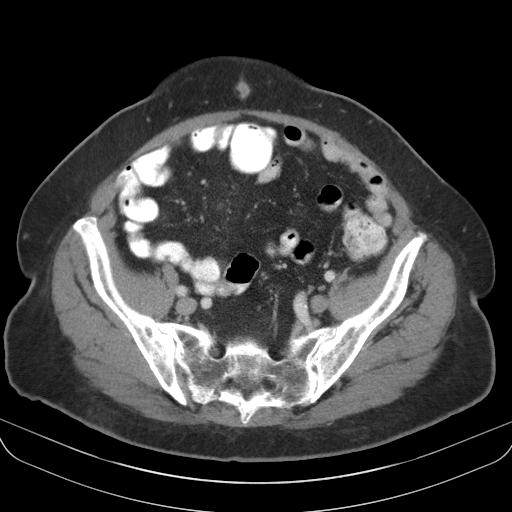
[im 35/78  soft-tissue]
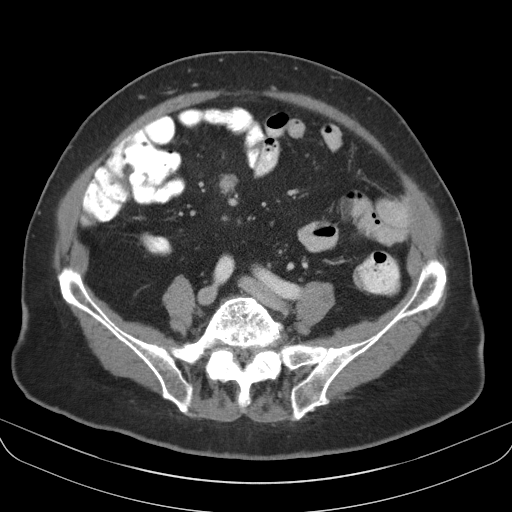
[im 39/78  soft-tissue]
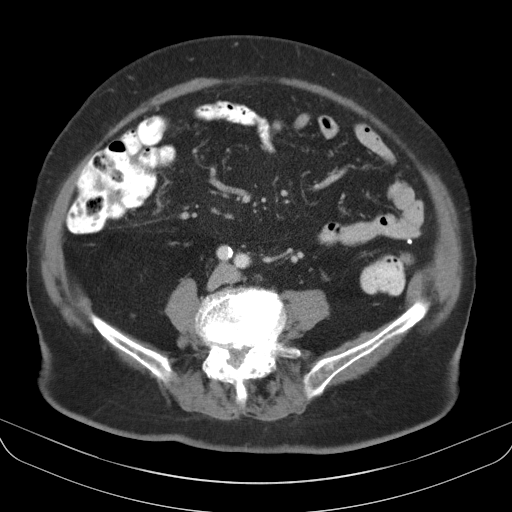
[im 43/78  soft-tissue]
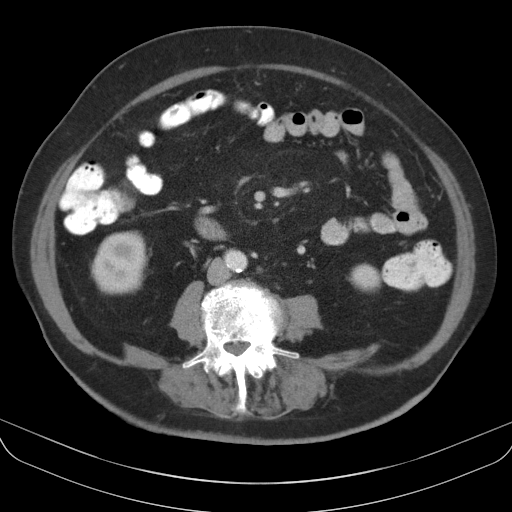
[im 51/78  soft-tissue]
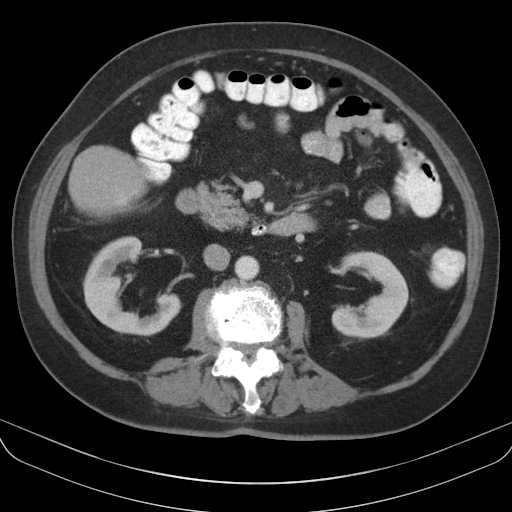
[im 51/78  bone]
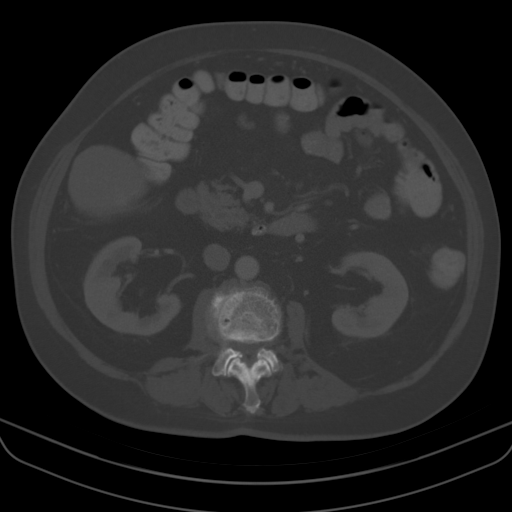
[im 54/78  soft-tissue]
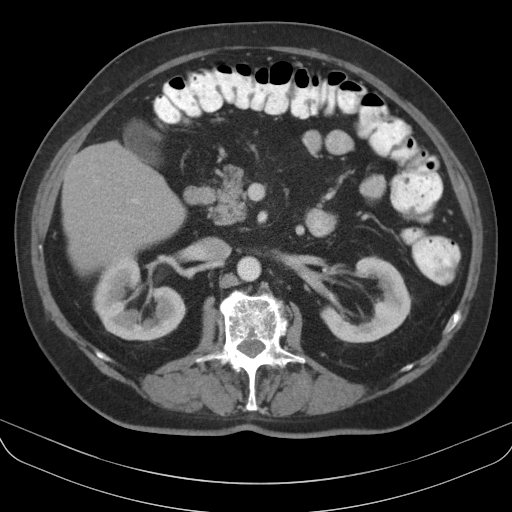
[im 62/78  soft-tissue]
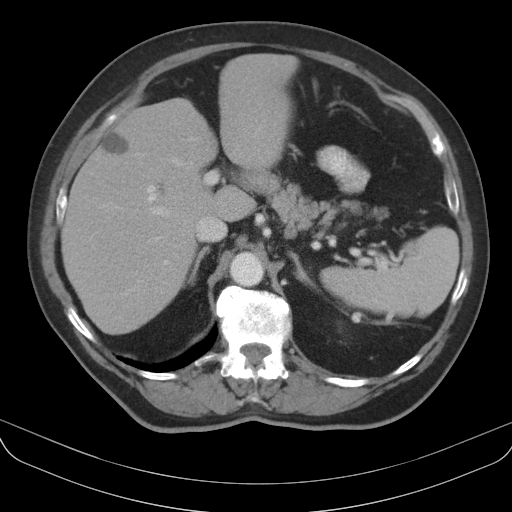
[im 62/78  lung]
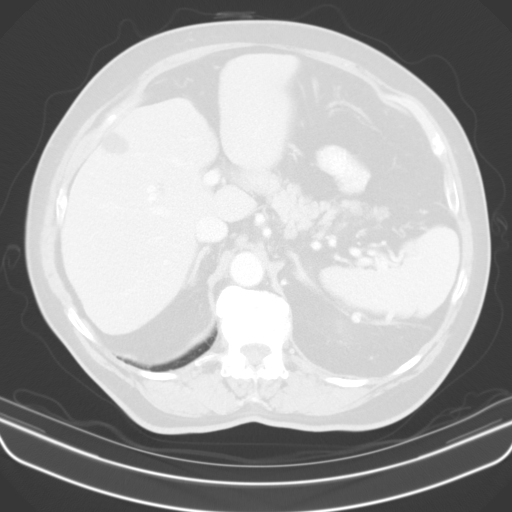
[im 66/78  soft-tissue]
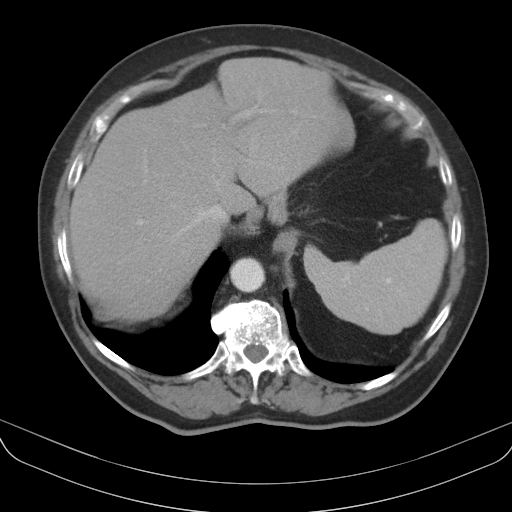
[im 66/78  lung]
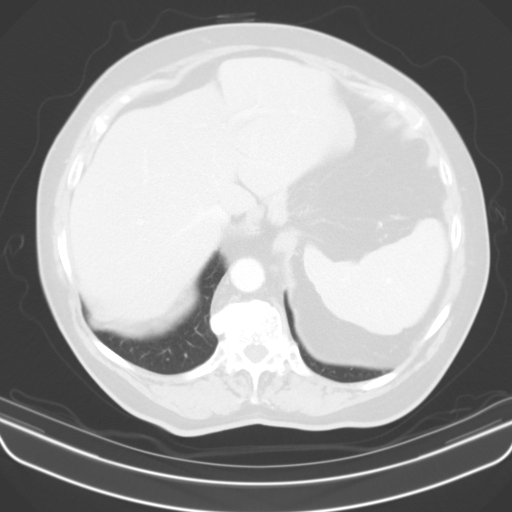
[im 70/78  lung]
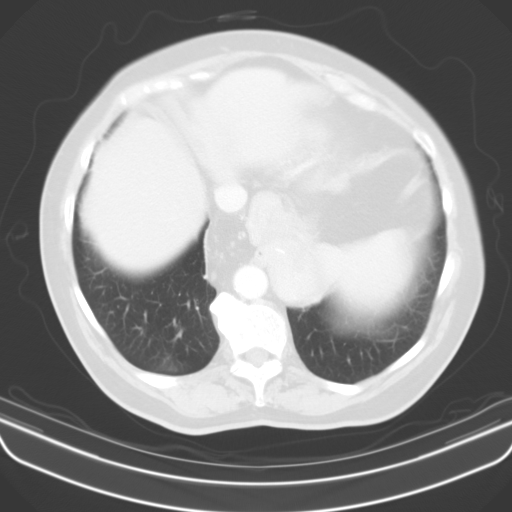
[im 74/78  soft-tissue]
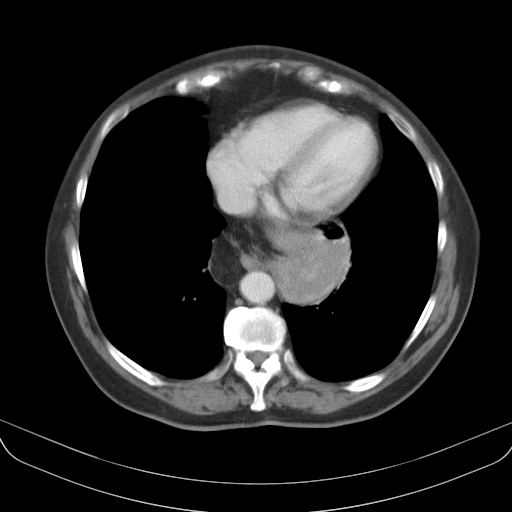
[im 74/78  lung]
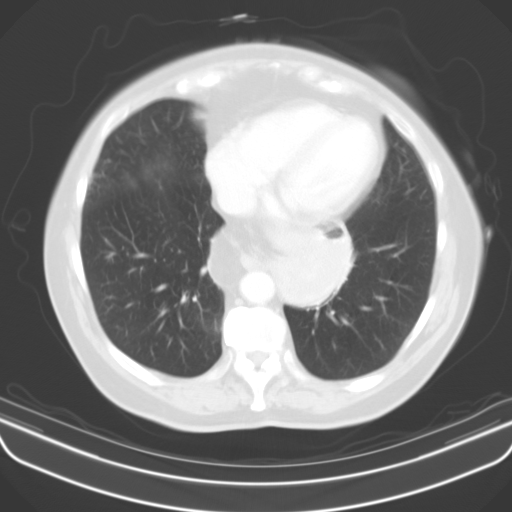

[14 of 32 positions shown; findings below may reference images not displayed]

FINDINGS: Bibasilar atelectasis

Large hiatal hernia.

Cholelithiasis

Benign appearing liver cysts. Contour over the right lobe of the
liver is somewhat nodular. Left lobe is slightly prominent. Early
cirrhotic changes not excluded.

Spleen, pancreas, adrenal glands are within normal limits

Cortical atrophy in the kidneys. Tiny hypodensities are nonspecific.

No free-fluid

No abnormal retroperitoneal adenopathy

Minimal aortic calcification

Sigmoid diverticulosis. No evidence of sigmoid diverticulitis.
Prominent stool burden in the rectum.

No evidence of small-bowel obstruction.  Normal appendix.

Bladder decompressed.  Uterus absent.  Adnexa are unremarkable.

Lumbar degenerative disc disease. Severe spinal stenosis at L4-5 is
suspected. No vertebral compression.
IMPRESSION: Cholelithiasis

Hiatal hernia

Possible early cirrhotic change.

Aortic atherosclerosis.

Benign appearing liver cyst.

Lumbar spinal stenosis.

## 2018-11-04 DIAGNOSIS — L02429 Furuncle of limb, unspecified: Secondary | ICD-10-CM | POA: Diagnosis not present

## 2018-11-04 DIAGNOSIS — K610 Anal abscess: Secondary | ICD-10-CM | POA: Diagnosis not present

## 2019-04-22 DIAGNOSIS — R21 Rash and other nonspecific skin eruption: Secondary | ICD-10-CM | POA: Diagnosis not present

## 2019-05-12 DIAGNOSIS — Z23 Encounter for immunization: Secondary | ICD-10-CM | POA: Diagnosis not present

## 2019-06-24 DIAGNOSIS — D500 Iron deficiency anemia secondary to blood loss (chronic): Secondary | ICD-10-CM | POA: Diagnosis not present

## 2019-06-24 DIAGNOSIS — I10 Essential (primary) hypertension: Secondary | ICD-10-CM | POA: Diagnosis not present

## 2019-06-24 DIAGNOSIS — Z23 Encounter for immunization: Secondary | ICD-10-CM | POA: Diagnosis not present

## 2019-06-24 DIAGNOSIS — E559 Vitamin D deficiency, unspecified: Secondary | ICD-10-CM | POA: Diagnosis not present

## 2019-06-24 DIAGNOSIS — Z8719 Personal history of other diseases of the digestive system: Secondary | ICD-10-CM | POA: Diagnosis not present

## 2019-06-24 DIAGNOSIS — Z1389 Encounter for screening for other disorder: Secondary | ICD-10-CM | POA: Diagnosis not present

## 2019-06-24 DIAGNOSIS — Z79899 Other long term (current) drug therapy: Secondary | ICD-10-CM | POA: Diagnosis not present

## 2019-06-24 DIAGNOSIS — Z0000 Encounter for general adult medical examination without abnormal findings: Secondary | ICD-10-CM | POA: Diagnosis not present

## 2019-06-24 DIAGNOSIS — E785 Hyperlipidemia, unspecified: Secondary | ICD-10-CM | POA: Diagnosis not present

## 2019-06-24 DIAGNOSIS — M810 Age-related osteoporosis without current pathological fracture: Secondary | ICD-10-CM | POA: Diagnosis not present

## 2019-06-24 DIAGNOSIS — R079 Chest pain, unspecified: Secondary | ICD-10-CM | POA: Diagnosis not present

## 2019-06-24 DIAGNOSIS — L259 Unspecified contact dermatitis, unspecified cause: Secondary | ICD-10-CM | POA: Diagnosis not present

## 2019-06-24 DIAGNOSIS — J45909 Unspecified asthma, uncomplicated: Secondary | ICD-10-CM | POA: Diagnosis not present

## 2019-07-08 DIAGNOSIS — K130 Diseases of lips: Secondary | ICD-10-CM | POA: Diagnosis not present

## 2019-10-13 DIAGNOSIS — R110 Nausea: Secondary | ICD-10-CM | POA: Diagnosis not present

## 2019-10-13 DIAGNOSIS — K5090 Crohn's disease, unspecified, without complications: Secondary | ICD-10-CM | POA: Diagnosis not present

## 2020-06-11 DIAGNOSIS — Z23 Encounter for immunization: Secondary | ICD-10-CM | POA: Diagnosis not present

## 2020-07-18 DIAGNOSIS — E559 Vitamin D deficiency, unspecified: Secondary | ICD-10-CM | POA: Diagnosis not present

## 2020-07-18 DIAGNOSIS — Z23 Encounter for immunization: Secondary | ICD-10-CM | POA: Diagnosis not present

## 2020-07-18 DIAGNOSIS — M810 Age-related osteoporosis without current pathological fracture: Secondary | ICD-10-CM | POA: Diagnosis not present

## 2020-07-18 DIAGNOSIS — R079 Chest pain, unspecified: Secondary | ICD-10-CM | POA: Diagnosis not present

## 2020-07-18 DIAGNOSIS — I10 Essential (primary) hypertension: Secondary | ICD-10-CM | POA: Diagnosis not present

## 2020-07-18 DIAGNOSIS — Z659 Problem related to unspecified psychosocial circumstances: Secondary | ICD-10-CM | POA: Diagnosis not present

## 2020-07-18 DIAGNOSIS — E785 Hyperlipidemia, unspecified: Secondary | ICD-10-CM | POA: Diagnosis not present

## 2020-07-18 DIAGNOSIS — Z1389 Encounter for screening for other disorder: Secondary | ICD-10-CM | POA: Diagnosis not present

## 2020-07-18 DIAGNOSIS — D500 Iron deficiency anemia secondary to blood loss (chronic): Secondary | ICD-10-CM | POA: Diagnosis not present

## 2020-07-18 DIAGNOSIS — Z0000 Encounter for general adult medical examination without abnormal findings: Secondary | ICD-10-CM | POA: Diagnosis not present

## 2020-07-18 DIAGNOSIS — Z8719 Personal history of other diseases of the digestive system: Secondary | ICD-10-CM | POA: Diagnosis not present

## 2020-07-18 DIAGNOSIS — J45909 Unspecified asthma, uncomplicated: Secondary | ICD-10-CM | POA: Diagnosis not present

## 2020-07-18 DIAGNOSIS — Z79899 Other long term (current) drug therapy: Secondary | ICD-10-CM | POA: Diagnosis not present

## 2020-07-21 DIAGNOSIS — Z23 Encounter for immunization: Secondary | ICD-10-CM | POA: Diagnosis not present

## 2020-10-13 ENCOUNTER — Other Ambulatory Visit (HOSPITAL_COMMUNITY): Payer: Self-pay | Admitting: Internal Medicine

## 2020-10-13 DIAGNOSIS — R079 Chest pain, unspecified: Secondary | ICD-10-CM | POA: Diagnosis not present

## 2020-10-13 DIAGNOSIS — Z8719 Personal history of other diseases of the digestive system: Secondary | ICD-10-CM | POA: Diagnosis not present

## 2020-10-13 DIAGNOSIS — I10 Essential (primary) hypertension: Secondary | ICD-10-CM | POA: Diagnosis not present

## 2020-10-13 DIAGNOSIS — E785 Hyperlipidemia, unspecified: Secondary | ICD-10-CM | POA: Diagnosis not present

## 2020-12-05 DIAGNOSIS — J45909 Unspecified asthma, uncomplicated: Secondary | ICD-10-CM | POA: Diagnosis not present

## 2020-12-05 DIAGNOSIS — E785 Hyperlipidemia, unspecified: Secondary | ICD-10-CM | POA: Diagnosis not present

## 2020-12-05 DIAGNOSIS — I10 Essential (primary) hypertension: Secondary | ICD-10-CM | POA: Diagnosis not present

## 2020-12-05 DIAGNOSIS — Z8719 Personal history of other diseases of the digestive system: Secondary | ICD-10-CM | POA: Diagnosis not present

## 2021-01-09 DIAGNOSIS — R000 Tachycardia, unspecified: Secondary | ICD-10-CM | POA: Diagnosis not present

## 2021-01-09 DIAGNOSIS — R531 Weakness: Secondary | ICD-10-CM | POA: Diagnosis not present

## 2021-01-09 DIAGNOSIS — D500 Iron deficiency anemia secondary to blood loss (chronic): Secondary | ICD-10-CM | POA: Diagnosis not present

## 2021-01-09 DIAGNOSIS — A09 Infectious gastroenteritis and colitis, unspecified: Secondary | ICD-10-CM | POA: Diagnosis not present

## 2021-02-07 DIAGNOSIS — I10 Essential (primary) hypertension: Secondary | ICD-10-CM | POA: Diagnosis not present

## 2021-02-07 DIAGNOSIS — J45909 Unspecified asthma, uncomplicated: Secondary | ICD-10-CM | POA: Diagnosis not present

## 2021-02-07 DIAGNOSIS — E785 Hyperlipidemia, unspecified: Secondary | ICD-10-CM | POA: Diagnosis not present

## 2021-02-07 DIAGNOSIS — D509 Iron deficiency anemia, unspecified: Secondary | ICD-10-CM | POA: Diagnosis not present

## 2021-03-22 ENCOUNTER — Other Ambulatory Visit (HOSPITAL_COMMUNITY): Payer: Self-pay

## 2021-04-19 ENCOUNTER — Emergency Department (HOSPITAL_COMMUNITY): Payer: Medicare Other

## 2021-04-19 ENCOUNTER — Inpatient Hospital Stay (HOSPITAL_COMMUNITY)
Admission: EM | Admit: 2021-04-19 | Discharge: 2021-05-11 | DRG: 871 | Disposition: E | Payer: Medicare Other | Attending: Internal Medicine | Admitting: Internal Medicine

## 2021-04-19 ENCOUNTER — Encounter (HOSPITAL_COMMUNITY): Payer: Self-pay | Admitting: *Deleted

## 2021-04-19 ENCOUNTER — Other Ambulatory Visit: Payer: Self-pay

## 2021-04-19 DIAGNOSIS — G9341 Metabolic encephalopathy: Secondary | ICD-10-CM | POA: Diagnosis present

## 2021-04-19 DIAGNOSIS — Z043 Encounter for examination and observation following other accident: Secondary | ICD-10-CM | POA: Diagnosis not present

## 2021-04-19 DIAGNOSIS — Z79899 Other long term (current) drug therapy: Secondary | ICD-10-CM

## 2021-04-19 DIAGNOSIS — N179 Acute kidney failure, unspecified: Secondary | ICD-10-CM | POA: Diagnosis not present

## 2021-04-19 DIAGNOSIS — Z888 Allergy status to other drugs, medicaments and biological substances status: Secondary | ICD-10-CM

## 2021-04-19 DIAGNOSIS — I10 Essential (primary) hypertension: Secondary | ICD-10-CM | POA: Diagnosis present

## 2021-04-19 DIAGNOSIS — R6521 Severe sepsis with septic shock: Secondary | ICD-10-CM | POA: Diagnosis present

## 2021-04-19 DIAGNOSIS — J45909 Unspecified asthma, uncomplicated: Secondary | ICD-10-CM | POA: Diagnosis present

## 2021-04-19 DIAGNOSIS — M47812 Spondylosis without myelopathy or radiculopathy, cervical region: Secondary | ICD-10-CM | POA: Diagnosis not present

## 2021-04-19 DIAGNOSIS — A419 Sepsis, unspecified organism: Secondary | ICD-10-CM | POA: Diagnosis not present

## 2021-04-19 DIAGNOSIS — J9811 Atelectasis: Secondary | ICD-10-CM | POA: Diagnosis not present

## 2021-04-19 DIAGNOSIS — E872 Acidosis, unspecified: Secondary | ICD-10-CM | POA: Diagnosis present

## 2021-04-19 DIAGNOSIS — Z87442 Personal history of urinary calculi: Secondary | ICD-10-CM

## 2021-04-19 DIAGNOSIS — D689 Coagulation defect, unspecified: Secondary | ICD-10-CM | POA: Diagnosis present

## 2021-04-19 DIAGNOSIS — Z452 Encounter for adjustment and management of vascular access device: Secondary | ICD-10-CM

## 2021-04-19 DIAGNOSIS — R4182 Altered mental status, unspecified: Secondary | ICD-10-CM | POA: Diagnosis not present

## 2021-04-19 DIAGNOSIS — Z20822 Contact with and (suspected) exposure to covid-19: Secondary | ICD-10-CM | POA: Diagnosis present

## 2021-04-19 DIAGNOSIS — Z90710 Acquired absence of both cervix and uterus: Secondary | ICD-10-CM

## 2021-04-19 DIAGNOSIS — K5090 Crohn's disease, unspecified, without complications: Secondary | ICD-10-CM | POA: Diagnosis present

## 2021-04-19 DIAGNOSIS — R4020 Unspecified coma: Secondary | ICD-10-CM | POA: Diagnosis not present

## 2021-04-19 DIAGNOSIS — N390 Urinary tract infection, site not specified: Secondary | ICD-10-CM | POA: Diagnosis present

## 2021-04-19 DIAGNOSIS — E860 Dehydration: Secondary | ICD-10-CM | POA: Diagnosis not present

## 2021-04-19 DIAGNOSIS — R7401 Elevation of levels of liver transaminase levels: Secondary | ICD-10-CM | POA: Diagnosis present

## 2021-04-19 DIAGNOSIS — L89220 Pressure ulcer of left hip, unstageable: Secondary | ICD-10-CM | POA: Diagnosis present

## 2021-04-19 DIAGNOSIS — Z7982 Long term (current) use of aspirin: Secondary | ICD-10-CM

## 2021-04-19 DIAGNOSIS — R571 Hypovolemic shock: Secondary | ICD-10-CM | POA: Diagnosis present

## 2021-04-19 DIAGNOSIS — L89890 Pressure ulcer of other site, unstageable: Secondary | ICD-10-CM | POA: Diagnosis present

## 2021-04-19 DIAGNOSIS — Z66 Do not resuscitate: Secondary | ICD-10-CM | POA: Diagnosis not present

## 2021-04-19 DIAGNOSIS — M1711 Unilateral primary osteoarthritis, right knee: Secondary | ICD-10-CM | POA: Diagnosis not present

## 2021-04-19 DIAGNOSIS — B9620 Unspecified Escherichia coli [E. coli] as the cause of diseases classified elsewhere: Secondary | ICD-10-CM | POA: Diagnosis present

## 2021-04-19 LAB — I-STAT VENOUS BLOOD GAS, ED
Acid-base deficit: 1 mmol/L (ref 0.0–2.0)
Bicarbonate: 23.4 mmol/L (ref 20.0–28.0)
Calcium, Ion: 1.17 mmol/L (ref 1.15–1.40)
HCT: 53 % — ABNORMAL HIGH (ref 36.0–46.0)
Hemoglobin: 18 g/dL — ABNORMAL HIGH (ref 12.0–15.0)
O2 Saturation: 87 %
Potassium: 4.1 mmol/L (ref 3.5–5.1)
Sodium: 155 mmol/L — ABNORMAL HIGH (ref 135–145)
TCO2: 25 mmol/L (ref 22–32)
pCO2, Ven: 38.6 mmHg — ABNORMAL LOW (ref 44.0–60.0)
pH, Ven: 7.391 (ref 7.250–7.430)
pO2, Ven: 53 mmHg — ABNORMAL HIGH (ref 32.0–45.0)

## 2021-04-19 LAB — I-STAT CHEM 8, ED
BUN: 107 mg/dL — ABNORMAL HIGH (ref 8–23)
Calcium, Ion: 1.16 mmol/L (ref 1.15–1.40)
Chloride: 118 mmol/L — ABNORMAL HIGH (ref 98–111)
Creatinine, Ser: 2.8 mg/dL — ABNORMAL HIGH (ref 0.44–1.00)
Glucose, Bld: 151 mg/dL — ABNORMAL HIGH (ref 70–99)
HCT: 53 % — ABNORMAL HIGH (ref 36.0–46.0)
Hemoglobin: 18 g/dL — ABNORMAL HIGH (ref 12.0–15.0)
Potassium: 4.1 mmol/L (ref 3.5–5.1)
Sodium: 155 mmol/L — ABNORMAL HIGH (ref 135–145)
TCO2: 24 mmol/L (ref 22–32)

## 2021-04-19 LAB — LACTIC ACID, PLASMA: Lactic Acid, Venous: 6.5 mmol/L (ref 0.5–1.9)

## 2021-04-19 MED ORDER — ONDANSETRON HCL 4 MG/2ML IJ SOLN
2.0000 mg | Freq: Once | INTRAMUSCULAR | Status: AC
  Administered 2021-04-20: 2 mg via INTRAVENOUS
  Filled 2021-04-19: qty 2

## 2021-04-19 MED ORDER — SODIUM CHLORIDE 0.9 % IV BOLUS
1000.0000 mL | Freq: Once | INTRAVENOUS | Status: AC
  Administered 2021-04-20: 1000 mL via INTRAVENOUS

## 2021-04-19 MED ORDER — SODIUM CHLORIDE 0.9 % IV BOLUS
1000.0000 mL | Freq: Once | INTRAVENOUS | Status: AC
  Administered 2021-04-19: 1000 mL via INTRAVENOUS

## 2021-04-19 NOTE — ED Triage Notes (Signed)
Pt lives alone and was last heard from 4 days ago. Family lives out of town, called for a Public house manager. EMS found pt on the floor, initally unresponsive with bp 68/0, tachycardic. On arrival, pt alert to self, follows commands.Pressure ulcer noted to LLQ and R knee

## 2021-04-19 NOTE — ED Provider Notes (Signed)
[facility] EMERGENCY DEPARTMENT Provider Note   CSN: [id number] Arrival date & time: 05/17/2021  2130     History Chief Complaint  Patient presents with   Altered Mental Status    Deloros Cassandra Garcia is a 86 y.o. female presenting for evaluation of altered mental status.  Level 5 caveat due to AMS.  Per EMS, patient's family last spoke to patient 4 days ago.  When they had not heard from her, they called for a well check.  On we will check arrival, patient was found laying on the floor on her left side.  She was unresponsive.  There were several animals in the house that had passed away due to not being fed.  Per EMS/family, patient lives at home by herself, is normally independent.   HCPOA is Juliann Pares, [phone]. Full code (discretion of healthcare agent per paperwork).     HPI     Past Medical History:  Diagnosis Date   Abdominal pain, left lower quadrant    Acute asthmatic bronchitis    Anemia    Crohn's disease (HCC)    Diverticulosis of colon    Hypertension    Nausea    Nephrolithiasis     Patient Active Problem List   Diagnosis Date Noted   Sepsis due to other etiology (HCC)    Hypokalemia    Sepsis (HCC) 12/12/2014   Essential hypertension 12/12/2014   Crohn's disease (HCC) 12/12/2014   Anemia of chronic disease 12/12/2014   Asthma, chronic 12/12/2014   REGIONAL ENTERITIS OF SMALL INTESTINE 01/02/2010   ANEMIA 02/16/2008   HYPERTENSION 02/16/2008   ASTHMATIC BRONCHITIS, ACUTE 02/16/2008   Diverticulosis of colon 02/16/2008   NAUSEA 02/16/2008   ABDOMINAL PAIN, LEFT LOWER QUADRANT 02/16/2008   NEPHROLITHIASIS, HX OF 02/16/2008    Past Surgical History:  Procedure Laterality Date   CATARACT EXTRACTION Right    VAGINAL HYSTERECTOMY       OB History   No obstetric history on file.     Family History  Problem Relation Age of Onset   Esophageal cancer Father    Liver cancer Brother    Leukemia Brother    Colon cancer Neg  Hx    Rectal cancer Neg Hx    Stomach cancer Neg Hx     Social History   Tobacco Use   Smoking status: Never   Smokeless tobacco: Never    Home Medications Prior to Admission medications   Medication Sig Start Date End Date Taking? Authorizing Provider  acetaminophen (TYLENOL) 500 MG tablet Take 500 mg by mouth as needed for mild pain or headache.     [provider]  albuterol (PROVENTIL HFA;VENTOLIN HFA) 108 (90 BASE) MCG/ACT inhaler Inhale 1 puff into the lungs every 6 (six) hours as needed (For asthma.).     [provider]  amoxicillin-clavulanate (AUGMENTIN) 875-125 MG tablet TAKE 1 TABLET BY MOUTH EVERY 12 HOURS 10/13/20 10/13/21  Marden Noble, MD  aspirin EC 81 MG tablet Take 81 mg by mouth daily.    [provider]  dicyclomine (BENTYL) 10 MG capsule TAKE 1 CAPSULE TWICE DAILY 08/29/15   Napoleon Form, MD  dimenhyDRINATE (DRAMAMINE) 50 MG tablet Take 50 mg by mouth every 8 (eight) hours as needed for nausea.    [provider]  folic acid (FOLVITE) 1 MG tablet TAKE 1 TABLET EVERY DAY 05/04/15   Hart Carwin, MD  furosemide (LASIX) 20 MG tablet Take 20 mg by mouth daily.  [provider]  lisinopril (PRINIVIL,ZESTRIL) 10 MG tablet Take 10 mg by mouth daily.      [provider]  metoprolol succinate (TOPROL-XL) 25 MG 24 hr tablet Take 25 mg by mouth daily.    [provider]  ondansetron (ZOFRAN) 4 MG tablet Take 1 tablet (4 mg total) by mouth every 6 (six) hours as needed for nausea or vomiting. Patient not taking: Reported on 07/11/2015 12/15/14   Calvert Cantor, MD  simvastatin (ZOCOR) 20 MG tablet Take 20 mg by mouth at bedtime.      [provider]  sulfaSALAzine (AZULFIDINE) 500 MG tablet TAKE 2 TABLETS TWICE DAILY 09/05/15   Napoleon Form, MD    Allergies    Iron dextran  Review of Systems   Review of Systems  Unable to perform ROS: Mental status change   Physical Exam Updated  Vital Signs BP (!) 84/61   Pulse 79   Temp (!) 95.4 F (35.2 C)   Resp (!) 22   SpO2 100%   Physical Exam Vitals and nursing note reviewed.  Constitutional:      Appearance: Normal appearance.  HENT:     Head: Normocephalic and atraumatic.  Eyes:     Extraocular Movements: Extraocular movements intact.     Conjunctiva/sclera: Conjunctivae normal.     Pupils: Pupils are equal, round, and reactive to light.  Cardiovascular:     Rate and Rhythm: Regular rhythm. Tachycardia present.     Pulses: Normal pulses.  Pulmonary:     Effort: Pulmonary effort is normal. No respiratory distress.     Breath sounds: Normal breath sounds. No wheezing.     Comments: Clear lung sounds in all fields. Contusion of the L lower chest wall/ribs Abdominal:     General: There is no distension.     Palpations: Abdomen is soft. There is no mass.     Tenderness: There is no abdominal tenderness. There is no guarding or rebound.  Musculoskeletal:     Cervical back: Normal range of motion and neck supple.     Comments: Large ulcers noted on the left lateral hip and the right medial knee  Skin:    General: Skin is warm and dry.     Capillary Refill: Capillary refill takes less than 2 seconds.  Neurological:     Comments: Oriented to self.  Repeating numbers and phrases nonsensically.        ED Results / Procedures / Treatments   Labs (all labs ordered are listed, but only abnormal results are displayed) Labs Reviewed  LACTIC ACID, PLASMA - Abnormal; Notable for the following components:      Result Value   Lactic Acid, Venous 6.5 (*)    All other components within normal limits  PROTIME-INR - Abnormal; Notable for the following components:   Prothrombin Time 18.4 (*)    INR 1.5 (*)    All other components within normal limits  APTT - Abnormal; Notable for the following components:   aPTT 23 (*)    All other components within normal limits  I-STAT VENOUS BLOOD GAS, ED - Abnormal; Notable for  the following components:   pCO2, Ven 38.6 (*)    pO2, Ven 53.0 (*)    Sodium 155 (*)    HCT 53.0 (*)    Hemoglobin 18.0 (*)    All other components within normal limits  I-STAT CHEM 8, ED - Abnormal; Notable for the following components:   Sodium 155 (*)  Chloride 118 (*)    BUN 107 (*)    Creatinine, Ser 2.80 (*)    Glucose, Bld 151 (*)    Hemoglobin 18.0 (*)    HCT 53.0 (*)    All other components within normal limits  URINE CULTURE  CULTURE, BLOOD (ROUTINE X 2)  CULTURE, BLOOD (ROUTINE X 2)  RESP PANEL BY RT-PCR (FLU A&B, COVID) ARPGX2  LACTIC ACID, PLASMA  COMPREHENSIVE METABOLIC PANEL  CBC WITH DIFFERENTIAL/PLATELET  URINALYSIS, ROUTINE W REFLEX MICROSCOPIC  CK    EKG EKG Interpretation  Date/Time:  Wednesday May 04, 2021 21:31:39 EDT Ventricular Rate:  114 PR Interval:    QRS Duration: 86 QT Interval:  353 QTC Calculation: 487 R Axis:   52 Text Interpretation: sinus tach Ventricular premature complex Borderline repolarization abnormality Borderline prolonged QT interval Confirmed by Benjiman Core [phone]) on 05-04-21 11:46:39 PM  Radiology DG Ribs Unilateral W/Chest Left  Result Date: May 04, 2021 CLINICAL DATA:  Fall EXAM: LEFT RIBS AND CHEST - 3+ VIEW COMPARISON:  Chest x-ray 05/02/2016 FINDINGS: Single-view chest demonstrates clear right lung fields. Atelectasis at the lingula and left base. No pleural effusion or pneumothorax. Retrocardiac opacity likely due to hiatal hernia. Left rib series demonstrates no definite acute displaced left rib fracture. IMPRESSION: 1. Atelectasis at the lingula and left base. No visible pneumothorax. 2. No definite acute displaced left rib fracture 3. Retrocardiac opacity likely due to hiatal hernia Electronically Signed   By: Jasmine Pang M.D.   On: 2021-05-04 23:03   CT HEAD WO CONTRAST ( )  Result Date: May 04, 2021 CLINICAL DATA:  Found down, unresponsive EXAM: CT HEAD WITHOUT CONTRAST CT CERVICAL SPINE WITHOUT  CONTRAST TECHNIQUE: Multidetector CT imaging of the head and cervical spine was performed following the standard protocol without intravenous contrast. Multiplanar CT image reconstructions of the cervical spine were also generated. COMPARISON:  None. FINDINGS: CT HEAD FINDINGS Brain: No evidence of acute infarction, hemorrhage, hydrocephalus, extra-axial collection or mass lesion/mass effect. Vascular: Intracranial atherosclerosis. Skull: Normal. Negative for fracture or focal lesion. Sinuses/Orbits: The visualized paranasal sinuses are essentially clear. The mastoid air cells are unopacified. Other: None. CT CERVICAL SPINE FINDINGS Alignment: Normal cervical lordosis. Skull base and vertebrae: No acute fracture. No primary bone lesion or focal pathologic process. Soft tissues and spinal canal: No prevertebral fluid or swelling. No visible canal hematoma. Disc levels: Mild degenerative changes. Fusion at C5-6. Spinal canal is patent. Upper chest: Visualized lung apices are clear. Other: Visualized thyroid is unremarkable. IMPRESSION: No evidence of acute intracranial abnormality. No evidence of traumatic injury to the cervical spine. C5-6 fusion. Mild degenerative changes. Electronically Signed   By: Charline Bills M.D.   On: 05-04-2021 22:41   CT Cervical Spine Wo Contrast  Result Date: 05/04/2021 CLINICAL DATA:  Found down, unresponsive EXAM: CT HEAD WITHOUT CONTRAST CT CERVICAL SPINE WITHOUT CONTRAST TECHNIQUE: Multidetector CT imaging of the head and cervical spine was performed following the standard protocol without intravenous contrast. Multiplanar CT image reconstructions of the cervical spine were also generated. COMPARISON:  None. FINDINGS: CT HEAD FINDINGS Brain: No evidence of acute infarction, hemorrhage, hydrocephalus, extra-axial collection or mass lesion/mass effect. Vascular: Intracranial atherosclerosis. Skull: Normal. Negative for fracture or focal lesion. Sinuses/Orbits: The visualized  paranasal sinuses are essentially clear. The mastoid air cells are unopacified. Other: None. CT CERVICAL SPINE FINDINGS Alignment: Normal cervical lordosis. Skull base and vertebrae: No acute fracture. No primary bone lesion or focal pathologic process. Soft tissues and spinal canal: No prevertebral fluid or swelling. No  visible canal hematoma. Disc levels: Mild degenerative changes. Fusion at C5-6. Spinal canal is patent. Upper chest: Visualized lung apices are clear. Other: Visualized thyroid is unremarkable. IMPRESSION: No evidence of acute intracranial abnormality. No evidence of traumatic injury to the cervical spine. C5-6 fusion. Mild degenerative changes. Electronically Signed   By: Charline BillsSriyesh  Krishnan M.D.   On: 04/16/2021 22:41   DG Knee Complete 4 Views Right  Result Date: 05/06/2021 CLINICAL DATA:  Fall EXAM: RIGHT KNEE - COMPLETE 4+ VIEW COMPARISON:  None. FINDINGS: No fracture or malalignment. Moderate severe tricompartment arthritis. No significant knee effusion IMPRESSION: Moderate severe tricompartment arthritis. No acute osseous abnormality Electronically Signed   By: Jasmine PangKim  Fujinaga M.D.   On: 04/29/2021 23:04   DG Hip Unilat W or Wo Pelvis 2-3 Views Left  Result Date: 04/16/2021 CLINICAL DATA:  Fall EXAM: DG HIP (WITH OR WITHOUT PELVIS) 2-3V LEFT COMPARISON:  None. FINDINGS: There is no evidence of hip fracture or dislocation. There is no evidence of arthropathy or other focal bone abnormality. IMPRESSION: Negative. Electronically Signed   By: Jasmine PangKim  Fujinaga M.D.   On: 05/01/2021 23:04    Procedures .Critical Care  Date/Time: 05/07/2021 11:37 PM Performed by: Alveria Apleyaccavale, Jordanny Waddington, PA-C Authorized by: Alveria Apleyaccavale, Tammy Wickliffe, PA-C   Critical care provider statement:    Critical care time (minutes):  60   Critical care time was exclusive of:  Separately billable procedures and treating other patients and teaching time   Critical care was necessary to treat or prevent imminent or  life-threatening deterioration of the following conditions:  CNS failure or compromise, dehydration and renal failure   Critical care was time spent personally by me on the following activities:  Blood draw for specimens, development of treatment plan with patient or surrogate, evaluation of patient's response to treatment, examination of patient, obtaining history from patient or surrogate, ordering and performing treatments and interventions, ordering and review of laboratory studies, review of old charts, re-evaluation of patient's condition, pulse oximetry and ordering and review of radiographic studies   I assumed direction of critical care for this patient from another provider in my specialty: no     Care discussed with: admitting provider     Medications Ordered in ED Medications  lactated ringers infusion (has no administration in time range)  ceFEPIme (MAXIPIME) 2 g in sodium chloride 0.9 % 100 mL IVPB (has no administration in time range)  metroNIDAZOLE (FLAGYL) IVPB 500 mg (has no administration in time range)  vancomycin (VANCOREADY) IVPB 1250 mg/250 mL (has no administration in time range)  lactated ringers bolus 1,000 mL (has no administration in time range)  norepinephrine (LEVOPHED) 4mg  in 250mL premix infusion (has no administration in time range)  lactated ringers bolus 1,000 mL (has no administration in time range)  lactated ringers infusion (has no administration in time range)  sodium chloride 0.9 % bolus 1,000 mL (1,000 mLs Intravenous New Bag/Given 04/20/21 0000)  sodium chloride 0.9 % bolus 1,000 mL (1,000 mLs Intravenous New Bag/Given 05/08/2021 2350)  ondansetron (ZOFRAN) injection 2 mg (2 mg Intravenous Given 04/20/21 0007)    ED Course  I have reviewed the triage vital signs and the nursing notes.  Pertinent labs & imaging results that were available during my care of the patient were reviewed by me and considered in my medical decision making (see chart for  details).    MDM Rules/Calculators/A&P  Pt presenting for evaluation of AMS. On exam, pt appears ill. Appears dehydrated, confused.  Large ulcers noted of the left hip and the right knee.  Contusion noted of the left lower ribs.  Will obtain labs including CK, CT head and neck, imaging of the chest, hips, and right knee.  While EMS reported patient was hypotensive, patient's initial blood pressure in the 130s.  She is hypothermic, warm blankets applied.  We will continue to monitor.  Unfortunately there was delay in getting blood work due to difficulty obtaining access.  CT head negative for acute findings.  X-rays viewed and independently interpreted by me, no fracture or dislocation.  On reevaluation, patient was found to be hypotensive and tachycardic.  She is still hypothermic.  Bair hugger ordered.  2 L of fluid ordered. Pt with stable mental status.   Chem-8 shows AKI and dehydration with a BUN of 107, sodium of 155, chloride of 118.  Fluids will treat this.  VBG shows a normal pH.  Remaining labs pending.  BP improved to 90's systolic.   On reevaluation, pt's BP 40-50 systolic. Levophed ordered. Pt's lactic elevated at 6.5. while there is no clear source of infection, and this could be due to hypotension, will tx for infection until cause is more clear. Broad spectrum abx ordered. 3'd bolus of LR ordered to complete 30 cc/kg.   On reevaluation, patient's blood pressure is much improved after 1.5L, and prior to levophed initiation.   On reassessment, pt's BP back in the 80's. Will consult critical care.   Discussed with Dr. Ardeth Perfect from critical care, who will evaluate the pt.   Final Clinical Impression(s) / ED Diagnoses Final diagnoses:  Altered mental status, unspecified altered mental status type  Dehydration  AKI (acute kidney injury) [facility])    Rx / DC Orders ED Discharge Orders     None        Alveria Apley, PA-C 04/20/21 0042     Benjiman Core, MD 04/24/21 [phone]

## 2021-04-19 NOTE — ED Notes (Signed)
Multiple IV attempts without success

## 2021-04-19 NOTE — ED Notes (Signed)
Pt in xray.  Xray stated that sats were noted to be in the 80's in CT.  I checked pt and moved pulse ox to ear.  Got a reading between 94-98.

## 2021-04-20 ENCOUNTER — Inpatient Hospital Stay (HOSPITAL_COMMUNITY): Payer: Medicare Other

## 2021-04-20 ENCOUNTER — Emergency Department (HOSPITAL_COMMUNITY): Payer: Medicare Other

## 2021-04-20 DIAGNOSIS — R400 Somnolence: Secondary | ICD-10-CM | POA: Diagnosis not present

## 2021-04-20 DIAGNOSIS — Z888 Allergy status to other drugs, medicaments and biological substances status: Secondary | ICD-10-CM | POA: Diagnosis not present

## 2021-04-20 DIAGNOSIS — E860 Dehydration: Secondary | ICD-10-CM | POA: Diagnosis present

## 2021-04-20 DIAGNOSIS — N179 Acute kidney failure, unspecified: Secondary | ICD-10-CM

## 2021-04-20 DIAGNOSIS — Z79899 Other long term (current) drug therapy: Secondary | ICD-10-CM | POA: Diagnosis not present

## 2021-04-20 DIAGNOSIS — G9341 Metabolic encephalopathy: Secondary | ICD-10-CM

## 2021-04-20 DIAGNOSIS — I517 Cardiomegaly: Secondary | ICD-10-CM | POA: Diagnosis not present

## 2021-04-20 DIAGNOSIS — J45909 Unspecified asthma, uncomplicated: Secondary | ICD-10-CM | POA: Diagnosis present

## 2021-04-20 DIAGNOSIS — Z87442 Personal history of urinary calculi: Secondary | ICD-10-CM | POA: Diagnosis not present

## 2021-04-20 DIAGNOSIS — R571 Hypovolemic shock: Secondary | ICD-10-CM | POA: Diagnosis present

## 2021-04-20 DIAGNOSIS — Z20822 Contact with and (suspected) exposure to covid-19: Secondary | ICD-10-CM | POA: Diagnosis present

## 2021-04-20 DIAGNOSIS — Z90710 Acquired absence of both cervix and uterus: Secondary | ICD-10-CM | POA: Diagnosis not present

## 2021-04-20 DIAGNOSIS — E872 Acidosis, unspecified: Secondary | ICD-10-CM | POA: Diagnosis present

## 2021-04-20 DIAGNOSIS — R7401 Elevation of levels of liver transaminase levels: Secondary | ICD-10-CM | POA: Diagnosis present

## 2021-04-20 DIAGNOSIS — K5090 Crohn's disease, unspecified, without complications: Secondary | ICD-10-CM | POA: Diagnosis present

## 2021-04-20 DIAGNOSIS — B9620 Unspecified Escherichia coli [E. coli] as the cause of diseases classified elsewhere: Secondary | ICD-10-CM | POA: Diagnosis present

## 2021-04-20 DIAGNOSIS — Z66 Do not resuscitate: Secondary | ICD-10-CM | POA: Diagnosis not present

## 2021-04-20 DIAGNOSIS — R4182 Altered mental status, unspecified: Secondary | ICD-10-CM | POA: Diagnosis present

## 2021-04-20 DIAGNOSIS — L89220 Pressure ulcer of left hip, unstageable: Secondary | ICD-10-CM | POA: Diagnosis present

## 2021-04-20 DIAGNOSIS — I10 Essential (primary) hypertension: Secondary | ICD-10-CM | POA: Diagnosis present

## 2021-04-20 DIAGNOSIS — L89890 Pressure ulcer of other site, unstageable: Secondary | ICD-10-CM | POA: Diagnosis present

## 2021-04-20 DIAGNOSIS — R6521 Severe sepsis with septic shock: Secondary | ICD-10-CM | POA: Diagnosis present

## 2021-04-20 DIAGNOSIS — N390 Urinary tract infection, site not specified: Secondary | ICD-10-CM | POA: Diagnosis present

## 2021-04-20 DIAGNOSIS — D689 Coagulation defect, unspecified: Secondary | ICD-10-CM | POA: Diagnosis present

## 2021-04-20 DIAGNOSIS — A419 Sepsis, unspecified organism: Secondary | ICD-10-CM | POA: Diagnosis present

## 2021-04-20 DIAGNOSIS — Z7982 Long term (current) use of aspirin: Secondary | ICD-10-CM | POA: Diagnosis not present

## 2021-04-20 LAB — CBC WITH DIFFERENTIAL/PLATELET
Abs Immature Granulocytes: 1.17 10*3/uL — ABNORMAL HIGH (ref 0.00–0.07)
Abs Immature Granulocytes: 1.17 10*3/uL — ABNORMAL HIGH (ref 0.00–0.07)
Basophils Absolute: 0 10*3/uL (ref 0.0–0.1)
Basophils Absolute: 0 10*3/uL (ref 0.0–0.1)
Basophils Relative: 0 %
Basophils Relative: 0 %
Eosinophils Absolute: 0 10*3/uL (ref 0.0–0.5)
Eosinophils Absolute: 0.1 10*3/uL (ref 0.0–0.5)
Eosinophils Relative: 0 %
Eosinophils Relative: 0 %
HCT: 57.3 % — ABNORMAL HIGH (ref 36.0–46.0)
HCT: 46.7 % — ABNORMAL HIGH (ref 36.0–46.0)
Hemoglobin: 18 g/dL — ABNORMAL HIGH (ref 12.0–15.0)
Hemoglobin: 14.6 g/dL (ref 12.0–15.0)
Immature Granulocytes: 3 %
Immature Granulocytes: 2 %
Lymphocytes Relative: 2 %
Lymphocytes Relative: 2 %
Lymphs Abs: 1 10*3/uL (ref 0.7–4.0)
Lymphs Abs: 1 10*3/uL (ref 0.7–4.0)
MCH: 31.7 pg (ref 26.0–34.0)
MCH: 31.6 pg (ref 26.0–34.0)
MCHC: 31.4 g/dL (ref 30.0–36.0)
MCHC: 31.3 g/dL (ref 30.0–36.0)
MCV: 100.9 fL — ABNORMAL HIGH (ref 80.0–100.0)
MCV: 101.1 fL — ABNORMAL HIGH (ref 80.0–100.0)
Monocytes Absolute: 0.8 10*3/uL (ref 0.1–1.0)
Monocytes Absolute: 0.6 10*3/uL (ref 0.1–1.0)
Monocytes Relative: 2 %
Monocytes Relative: 1 %
Neutro Abs: 41.2 10*3/uL — ABNORMAL HIGH (ref 1.7–7.7)
Neutro Abs: 46.6 10*3/uL — ABNORMAL HIGH (ref 1.7–7.7)
Neutrophils Relative %: 93 %
Neutrophils Relative %: 95 %
Platelets: 326 10*3/uL (ref 150–400)
Platelets: 191 10*3/uL (ref 150–400)
RBC: 5.68 MIL/uL — ABNORMAL HIGH (ref 3.87–5.11)
RBC: 4.62 MIL/uL (ref 3.87–5.11)
RDW: 14.5 % (ref 11.5–15.5)
RDW: 14.7 % (ref 11.5–15.5)
WBC: 44.2 10*3/uL — ABNORMAL HIGH (ref 4.0–10.5)
WBC: 49.5 10*3/uL — ABNORMAL HIGH (ref 4.0–10.5)
nRBC: 0.2 % (ref 0.0–0.2)
nRBC: 0.3 % — ABNORMAL HIGH (ref 0.0–0.2)

## 2021-04-20 LAB — COMPREHENSIVE METABOLIC PANEL
ALT: 41 U/L (ref 0–44)
ALT: 143 U/L — ABNORMAL HIGH (ref 0–44)
AST: 50 U/L — ABNORMAL HIGH (ref 15–41)
AST: 261 U/L — ABNORMAL HIGH (ref 15–41)
Albumin: 2.5 g/dL — ABNORMAL LOW (ref 3.5–5.0)
Albumin: 1.6 g/dL — ABNORMAL LOW (ref 3.5–5.0)
Alkaline Phosphatase: 118 U/L (ref 38–126)
Alkaline Phosphatase: 92 U/L (ref 38–126)
Anion gap: 18 — ABNORMAL HIGH (ref 5–15)
Anion gap: 13 (ref 5–15)
BUN: 115 mg/dL — ABNORMAL HIGH (ref 8–23)
BUN: 95 mg/dL — ABNORMAL HIGH (ref 8–23)
CO2: 21 mmol/L — ABNORMAL LOW (ref 22–32)
CO2: 19 mmol/L — ABNORMAL LOW (ref 22–32)
Calcium: 10 mg/dL (ref 8.9–10.3)
Calcium: 8 mg/dL — ABNORMAL LOW (ref 8.9–10.3)
Chloride: 115 mmol/L — ABNORMAL HIGH (ref 98–111)
Chloride: 112 mmol/L — ABNORMAL HIGH (ref 98–111)
Creatinine, Ser: 2.95 mg/dL — ABNORMAL HIGH (ref 0.44–1.00)
Creatinine, Ser: 2.47 mg/dL — ABNORMAL HIGH (ref 0.44–1.00)
GFR, Estimated: 15 mL/min — ABNORMAL LOW (ref >60)
GFR, Estimated: 19 mL/min — ABNORMAL LOW (ref >60)
Glucose, Bld: 153 mg/dL — ABNORMAL HIGH (ref 70–99)
Glucose, Bld: 213 mg/dL — ABNORMAL HIGH (ref 70–99)
Potassium: 4.1 mmol/L (ref 3.5–5.1)
Potassium: 4.3 mmol/L (ref 3.5–5.1)
Sodium: 154 mmol/L — ABNORMAL HIGH (ref 135–145)
Sodium: 144 mmol/L (ref 135–145)
Total Bilirubin: 1.8 mg/dL — ABNORMAL HIGH (ref 0.3–1.2)
Total Bilirubin: 2.1 mg/dL — ABNORMAL HIGH (ref 0.3–1.2)
Total Protein: 5.7 g/dL — ABNORMAL LOW (ref 6.5–8.1)
Total Protein: 3.9 g/dL — ABNORMAL LOW (ref 6.5–8.1)

## 2021-04-20 LAB — URINALYSIS, ROUTINE W REFLEX MICROSCOPIC
Bilirubin Urine: NEGATIVE
Glucose, UA: NEGATIVE mg/dL
Ketones, ur: NEGATIVE mg/dL
Nitrite: NEGATIVE
Protein, ur: 30 mg/dL — AB
Specific Gravity, Urine: 1.017 (ref 1.005–1.030)
pH: 5 (ref 5.0–8.0)

## 2021-04-20 LAB — DIC (DISSEMINATED INTRAVASCULAR COAGULATION)PANEL
D-Dimer, Quant: 9.06 ug/mL-FEU — ABNORMAL HIGH (ref 0.00–0.50)
Fibrinogen: 363 mg/dL (ref 210–475)
INR: 2 — ABNORMAL HIGH (ref 0.8–1.2)
Platelets: 186 10*3/uL (ref 150–400)
Prothrombin Time: 22.4 seconds — ABNORMAL HIGH (ref 11.4–15.2)
Smear Review: NONE SEEN
aPTT: 33 seconds (ref 24–36)

## 2021-04-20 LAB — RESP PANEL BY RT-PCR (FLU A&B, COVID) ARPGX2
Influenza A by PCR: NEGATIVE
Influenza B by PCR: NEGATIVE
SARS Coronavirus 2 by RT PCR: NEGATIVE

## 2021-04-20 LAB — BLOOD CULTURE ID PANEL (REFLEXED) - BCID2

## 2021-04-20 LAB — MRSA NEXT GEN BY PCR, NASAL: MRSA by PCR Next Gen: NOT DETECTED

## 2021-04-20 LAB — BASIC METABOLIC PANEL
Anion gap: 16 — ABNORMAL HIGH (ref 5–15)
BUN: 96 mg/dL — ABNORMAL HIGH (ref 8–23)
CO2: 20 mmol/L — ABNORMAL LOW (ref 22–32)
Calcium: 8.1 mg/dL — ABNORMAL LOW (ref 8.9–10.3)
Chloride: 114 mmol/L — ABNORMAL HIGH (ref 98–111)
Creatinine, Ser: 2.29 mg/dL — ABNORMAL HIGH (ref 0.44–1.00)
GFR, Estimated: 21 mL/min — ABNORMAL LOW (ref >60)
Glucose, Bld: 136 mg/dL — ABNORMAL HIGH (ref 70–99)
Potassium: 4.1 mmol/L (ref 3.5–5.1)
Sodium: 150 mmol/L — ABNORMAL HIGH (ref 135–145)

## 2021-04-20 LAB — ECHOCARDIOGRAM COMPLETE
Area-P 1/2: 2.08 cm2
Height: 64 in
S' Lateral: 1.6 cm
Weight: 2395.08 oz

## 2021-04-20 LAB — APTT
aPTT: 23 seconds — ABNORMAL LOW (ref 24–36)
aPTT: 30 seconds (ref 24–36)

## 2021-04-20 LAB — CBC
HCT: 44.7 % (ref 36.0–46.0)
Hemoglobin: 14.1 g/dL (ref 12.0–15.0)
MCH: 31.9 pg (ref 26.0–34.0)
MCHC: 31.5 g/dL (ref 30.0–36.0)
MCV: 101.1 fL — ABNORMAL HIGH (ref 80.0–100.0)
Platelets: 221 10*3/uL (ref 150–400)
RBC: 4.42 MIL/uL (ref 3.87–5.11)
RDW: 14.6 % (ref 11.5–15.5)
WBC: 44.8 10*3/uL — ABNORMAL HIGH (ref 4.0–10.5)
nRBC: 0.3 % — ABNORMAL HIGH (ref 0.0–0.2)

## 2021-04-20 LAB — PHOSPHORUS: Phosphorus: 4.6 mg/dL (ref 2.5–4.6)

## 2021-04-20 LAB — CK: Total CK: 307 U/L — ABNORMAL HIGH (ref 38–234)

## 2021-04-20 LAB — COOXEMETRY PANEL
Carboxyhemoglobin: 0.3 % — ABNORMAL LOW (ref 0.5–1.5)
Methemoglobin: 1.3 % (ref 0.0–1.5)
O2 Saturation: 73.6 %
Total hemoglobin: 14.1 g/dL (ref 12.0–16.0)

## 2021-04-20 LAB — CORTISOL: Cortisol, Plasma: 99.6 ug/dL

## 2021-04-20 LAB — PROTIME-INR
INR: 1.5 — ABNORMAL HIGH (ref 0.8–1.2)
INR: 2.1 — ABNORMAL HIGH (ref 0.8–1.2)
Prothrombin Time: 18.4 seconds — ABNORMAL HIGH (ref 11.4–15.2)
Prothrombin Time: 23.6 seconds — ABNORMAL HIGH (ref 11.4–15.2)

## 2021-04-20 LAB — MAGNESIUM: Magnesium: 1.6 mg/dL — ABNORMAL LOW (ref 1.7–2.4)

## 2021-04-20 LAB — LACTIC ACID, PLASMA
Lactic Acid, Venous: 4.2 mmol/L (ref 0.5–1.9)
Lactic Acid, Venous: 5.2 mmol/L (ref 0.5–1.9)

## 2021-04-20 LAB — GLUCOSE, CAPILLARY: Glucose-Capillary: 134 mg/dL — ABNORMAL HIGH (ref 70–99)

## 2021-04-20 MED ORDER — VASOPRESSIN 20 UNITS/100 ML INFUSION FOR SHOCK
0.0000 [IU]/min | INTRAVENOUS | Status: DC
  Administered 2021-04-20: 0.03 [IU]/min via INTRAVENOUS
  Filled 2021-04-20: qty 100

## 2021-04-20 MED ORDER — LACTATED RINGERS IV SOLN: INTRAVENOUS | Status: AC

## 2021-04-20 MED ORDER — HEPARIN SODIUM (PORCINE) 5000 UNIT/ML IJ SOLN
5000.0000 [IU] | Freq: Three times a day (TID) | INTRAMUSCULAR | Status: DC
  Administered 2021-04-20: 5000 [IU] via SUBCUTANEOUS
  Filled 2021-04-20: qty 1

## 2021-04-20 MED ORDER — VANCOMYCIN HCL 1250 MG/250ML IV SOLN
1250.0000 mg | Freq: Once | INTRAVENOUS | Status: DC
  Filled 2021-04-20: qty 250

## 2021-04-20 MED ORDER — VANCOMYCIN HCL IN DEXTROSE 1-5 GM/200ML-% IV SOLN
1000.0000 mg | Freq: Once | INTRAVENOUS | Status: AC
  Administered 2021-04-20: 1000 mg via INTRAVENOUS
  Filled 2021-04-20: qty 200

## 2021-04-20 MED ORDER — ONDANSETRON 4 MG PO TBDP
4.0000 mg | ORAL_TABLET | Freq: Four times a day (QID) | ORAL | Status: DC | PRN
  Filled 2021-04-20: qty 1

## 2021-04-20 MED ORDER — CHLORHEXIDINE GLUCONATE 0.12 % MT SOLN
15.0000 mL | Freq: Two times a day (BID) | OROMUCOSAL | Status: DC
  Administered 2021-04-20 (×2): 15 mL via OROMUCOSAL

## 2021-04-20 MED ORDER — DOCUSATE SODIUM 100 MG PO CAPS: 100.0000 mg | ORAL_CAPSULE | Freq: Two times a day (BID) | ORAL | Status: DC | PRN

## 2021-04-20 MED ORDER — ONDANSETRON HCL 4 MG/2ML IJ SOLN: 4.0000 mg | Freq: Four times a day (QID) | INTRAMUSCULAR | Status: DC | PRN

## 2021-04-20 MED ORDER — SODIUM CHLORIDE 0.9 % IV SOLN: 2.0000 g | INTRAVENOUS | Status: DC

## 2021-04-20 MED ORDER — LACTATED RINGERS IV SOLN: INTRAVENOUS | Status: DC

## 2021-04-20 MED ORDER — ACETAMINOPHEN 325 MG PO TABS: 650.0000 mg | ORAL_TABLET | Freq: Four times a day (QID) | ORAL | Status: DC | PRN

## 2021-04-20 MED ORDER — MORPHINE SULFATE (PF) 2 MG/ML IV SOLN: 2.0000 mg | INTRAVENOUS | Status: DC | PRN

## 2021-04-20 MED ORDER — ORAL CARE MOUTH RINSE
15.0000 mL | Freq: Two times a day (BID) | OROMUCOSAL | Status: DC
  Administered 2021-04-20 (×2): 15 mL via OROMUCOSAL

## 2021-04-20 MED ORDER — NOREPINEPHRINE 4 MG/250ML-% IV SOLN
5.0000 ug/min | INTRAVENOUS | Status: DC
  Administered 2021-04-20: 22 ug/min via INTRAVENOUS
  Administered 2021-04-20: 24 ug/min via INTRAVENOUS
  Filled 2021-04-20 (×3): qty 250

## 2021-04-20 MED ORDER — LACTATED RINGERS IV SOLN: Freq: Once | INTRAVENOUS | Status: AC

## 2021-04-20 MED ORDER — MORPHINE 100MG IN NS 100ML (1MG/ML) PREMIX INFUSION
0.0000 mg/h | INTRAVENOUS | Status: DC
  Administered 2021-04-20: 5 mg/h via INTRAVENOUS
  Administered 2021-04-20: 40 mg/h via INTRAVENOUS
  Administered 2021-04-20 (×2): 45 mg/h via INTRAVENOUS
  Administered 2021-04-20: 5 mg/h via INTRAVENOUS
  Administered 2021-04-21: 45 mg/h via INTRAVENOUS
  Filled 2021-04-20 (×5): qty 100

## 2021-04-20 MED ORDER — MIDAZOLAM HCL 2 MG/2ML IJ SOLN
2.0000 mg | INTRAMUSCULAR | Status: DC | PRN
  Administered 2021-04-20: 4 mg via INTRAVENOUS

## 2021-04-20 MED ORDER — HALOPERIDOL LACTATE 5 MG/ML IJ SOLN: 2.5000 mg | INTRAMUSCULAR | Status: DC | PRN

## 2021-04-20 MED ORDER — MIDAZOLAM HCL 2 MG/2ML IJ SOLN
2.0000 mg | INTRAMUSCULAR | Status: DC | PRN
  Administered 2021-04-20: 2 mg via INTRAVENOUS
  Filled 2021-04-20: qty 4
  Filled 2021-04-20: qty 2

## 2021-04-20 MED ORDER — MORPHINE BOLUS VIA INFUSION
5.0000 mg | INTRAVENOUS | Status: DC | PRN
  Administered 2021-04-20: 5 mg via INTRAVENOUS
  Filled 2021-04-20: qty 5

## 2021-04-20 MED ORDER — ACETAMINOPHEN 650 MG RE SUPP: 650.0000 mg | Freq: Four times a day (QID) | RECTAL | Status: DC | PRN

## 2021-04-20 MED ORDER — GLYCOPYRROLATE 0.2 MG/ML IJ SOLN: 0.2000 mg | INTRAMUSCULAR | Status: DC | PRN

## 2021-04-20 MED ORDER — POLYVINYL ALCOHOL 1.4 % OP SOLN
1.0000 [drp] | Freq: Four times a day (QID) | OPHTHALMIC | Status: DC | PRN
  Filled 2021-04-20: qty 15

## 2021-04-20 MED ORDER — LACTATED RINGERS IV BOLUS (SEPSIS)
1000.0000 mL | Freq: Once | INTRAVENOUS | Status: AC
  Administered 2021-04-20: 1000 mL via INTRAVENOUS

## 2021-04-20 MED ORDER — POLYETHYLENE GLYCOL 3350 17 G PO PACK: 17.0000 g | PACK | Freq: Every day | ORAL | Status: DC | PRN

## 2021-04-20 MED ORDER — DEXTROSE 5 % IV SOLN: INTRAVENOUS | Status: DC

## 2021-04-20 MED ORDER — NOREPINEPHRINE 4 MG/250ML-% IV SOLN
0.0000 ug/min | INTRAVENOUS | Status: DC
  Administered 2021-04-20: 4 ug/min via INTRAVENOUS
  Filled 2021-04-20: qty 250

## 2021-04-20 MED ORDER — DIPHENHYDRAMINE HCL 50 MG/ML IJ SOLN: 25.0000 mg | INTRAMUSCULAR | Status: DC | PRN

## 2021-04-20 MED ORDER — FENTANYL CITRATE (PF) 100 MCG/2ML IJ SOLN
12.5000 ug | INTRAMUSCULAR | Status: DC | PRN
Start: 2021-04-20 — End: 2021-04-21

## 2021-04-20 MED ORDER — VASOPRESSIN 20 UNITS/100 ML INFUSION FOR SHOCK
0.0400 [IU]/min | INTRAVENOUS | Status: DC
  Administered 2021-04-20: 0.04 [IU]/min via INTRAVENOUS
  Filled 2021-04-20: qty 100

## 2021-04-20 MED ORDER — PANTOPRAZOLE SODIUM 40 MG IV SOLR: 40.0000 mg | Freq: Every day | INTRAVENOUS | Status: DC

## 2021-04-20 MED ORDER — NOREPINEPHRINE 16 MG/250ML-% IV SOLN
5.0000 ug/min | INTRAVENOUS | Status: DC
  Administered 2021-04-20: 24 ug/min via INTRAVENOUS
  Filled 2021-04-20: qty 250

## 2021-04-20 MED ORDER — LACTATED RINGERS IV BOLUS
1000.0000 mL | Freq: Once | INTRAVENOUS | Status: AC
  Administered 2021-04-20: 1000 mL via INTRAVENOUS

## 2021-04-20 MED ORDER — MAGNESIUM SULFATE 2 GM/50ML IV SOLN
2.0000 g | Freq: Once | INTRAVENOUS | Status: AC
  Administered 2021-04-20: 2 g via INTRAVENOUS
  Filled 2021-04-20: qty 50

## 2021-04-20 MED ORDER — GLYCOPYRROLATE 1 MG PO TABS
1.0000 mg | ORAL_TABLET | ORAL | Status: DC | PRN
  Filled 2021-04-20: qty 1

## 2021-04-20 MED ORDER — SODIUM CHLORIDE 0.9 % IV SOLN
250.0000 mL | INTRAVENOUS | Status: DC
  Administered 2021-04-20: 250 mL via INTRAVENOUS

## 2021-04-20 MED ORDER — VANCOMYCIN VARIABLE DOSE PER UNSTABLE RENAL FUNCTION (PHARMACIST DOSING): Status: DC

## 2021-04-20 MED ORDER — SODIUM CHLORIDE 0.9 % IV SOLN
2.0000 g | Freq: Once | INTRAVENOUS | Status: AC
  Administered 2021-04-20: 2 g via INTRAVENOUS
  Filled 2021-04-20: qty 2

## 2021-04-20 MED ORDER — DAKINS (1/4 STRENGTH) 0.125 % EX SOLN
Freq: Two times a day (BID) | CUTANEOUS | Status: DC
  Filled 2021-04-20: qty 473

## 2021-04-20 MED ORDER — CHLORHEXIDINE GLUCONATE CLOTH 2 % EX PADS
6.0000 | MEDICATED_PAD | Freq: Every day | CUTANEOUS | Status: DC
  Administered 2021-04-20: 6 via TOPICAL

## 2021-04-20 MED ORDER — METRONIDAZOLE 500 MG/100ML IV SOLN: 500.0000 mg | Freq: Once | INTRAVENOUS | Status: DC

## 2021-04-20 NOTE — Progress Notes (Signed)
eLink Physician-Brief Progress Note Patient Name: Cassandra Garcia DOB: November 19, 1934 MRN: [id number]   Date of Service  04/20/2021  HPI/Events of Note  Request to review CXR for L IJ CVL placement. L IJ venous catheter terminates in the upper SVC. No pneumothorax.     eICU Interventions  OK to use L IJ central venous catheter.      Intervention Category Major Interventions: Other:  Lenell Antu 04/20/2021, 5:04 AM

## 2021-04-20 NOTE — ED Notes (Signed)
Chest xray portable complete   Post central line placement

## 2021-04-20 NOTE — ED Notes (Signed)
Report called to rn on 2h 

## 2021-04-20 NOTE — H&P (Signed)
NAME:  Cassandra Garcia MRN:  [id number] DOB:  1934/09/25 LOS: 0 ADMISSION DATE:  06-May-2021 DATE OF SERVICE:  04/20/2021  CHIEF COMPLAINT:  altered mental status/unresponsive   HISTORY & PHYSICAL  History of Present Illness  This 86 y.o. Caucasian female presented to the [facility] Emergency Department via EMS with complaints of altered mental status.  The patient's last known well time is 4 days ago, based on telephone contact with family members.  Due to concerns for her well-being, a welfare check was apparently requested that resulted in discovering her to be on the floor and unresponsive.  Her initial BP was reported to be 68/palp.  On arrival to ER, she remained hypotensive but did demonstrate some improvement in mentation.   At the time of clinical encounter, the patient is arousable.  She follows some commands but does not verbally respond appropriately.  She is unable to provide answers for orientation but denies pain or discomfort.  CT head and neck were unrevealing for acute processes.  REVIEW OF SYSTEMS This patient is critically ill and cannot provide additional history nor review of systems due to mental status/unconsciousness.   Past Medical/Surgical/Social/Family History   Past Medical History:  Diagnosis Date   Abdominal pain, left lower quadrant    Acute asthmatic bronchitis    Anemia    Crohn's disease (HCC)    Diverticulosis of colon    Hypertension    Nausea    Nephrolithiasis     Past Surgical History:  Procedure Laterality Date   CATARACT EXTRACTION Right    VAGINAL HYSTERECTOMY      Social History   Tobacco Use   Smoking status: Never   Smokeless tobacco: Never  Substance Use Topics   Alcohol use: Not on file    Family History  Problem Relation Age of Onset   Esophageal cancer Father    Liver cancer Brother    Leukemia Brother    Colon cancer Neg Hx    Rectal cancer Neg Hx    Stomach cancer Neg Hx      Procedures:      Significant Diagnostic Tests:     Micro Data:   Results for orders placed or performed during the hospital encounter of 12/12/14  Blood culture (routine x 2)     Status: None   Collection Time: 12/12/14 10:56 PM   Specimen: BLOOD  Result Value Ref Range Status   Specimen Description BLOOD BLOOD LEFT FOREARM  Final   Special Requests BOTTLES DRAWN AEROBIC AND ANAEROBIC  Final   Culture   Final    NO GROWTH 5 DAYS Performed at [facility]    Report Status 12/19/2014 FINAL  Final  Blood culture (routine x 2)     Status: None   Collection Time: 12/12/14 11:18 PM   Specimen: BLOOD  Result Value Ref Range Status   Specimen Description BLOOD RIGHT HAND  Final   Special Requests BOTTLES DRAWN AEROBIC ONLY 3CC  Final   Culture   Final    NO GROWTH 5 DAYS Performed at [facility]    Report Status 12/19/2014 FINAL  Final  Respiratory virus panel     Status: None   Collection Time: 12/13/14  2:16 AM   Specimen: Nasal Swab; Respiratory  Result Value Ref Range Status   Source - RVPAN NOSE  Corrected   Respiratory Syncytial Virus A Negative Negative Final   Respiratory Syncytial Virus B Negative Negative Final  Influenza A Negative Negative Final   Influenza B Negative Negative Final   Parainfluenza 1 Negative Negative Final   Parainfluenza 2 Negative Negative Final   Parainfluenza 3 Negative Negative Final   Metapneumovirus Negative Negative Final   Rhinovirus Negative Negative Final   Adenovirus Negative Negative Final    Comment: (NOTE) Performed At: [facility] [address] [place], [place] [id number] Mila Homer MD PT:[id number]   Urine culture     Status: None   Collection Time: 12/13/14 12:27 PM   Specimen: Urine, Clean Catch  Result Value Ref Range Status   Specimen Description URINE, CLEAN CATCH  Final   Special Requests NONE  Final   Colony Count NO GROWTH Performed at [facility]   Final   Culture NO  GROWTH Performed at [facility]   Final   Report Status 12/14/2014 FINAL  Final      Antimicrobials:  Cefepime/Flagyl/vancomycin (8/10>> )   Interim history/subjective:     Objective   BP (!) 89/40   Pulse (!) 103   Temp (!) 95.4 F (35.2 C)   Resp (!) 28   SpO2 96%     There were no vitals filed for this visit. No intake or output data in the 24 hours ending 04/20/21 0244      Examination: GENERAL:  confused.  Unkempt.  No acute distress. HEAD: normocephalic, atraumatic EYE: PERRLA, EOM intact, no scleral icterus, no pallor. NOSE: nares are patent. No polyps. No exudate. No sinus tenderness. THROAT/ORAL CAVITY: Normal dentition. No oral thrush. No exudate. Mucous membranes are dry. No tonsillar enlargement.  Mallampati class III (soft and hard palate and base of uvula visible) airway. NECK: supple, no thyromegaly, no JVD, no lymphadenopathy. Trachea midline. CHEST/LUNG: symmetric in development and expansion. Good air entry. No crackles. No wheezes. HEART: Regular S1 and S2 without murmur, rub or gallop. ABDOMEN: soft, nontender, nondistended. Normoactive bowel sounds. No rebound. No guarding. No hepatosplenomegaly. EXTREMITIES: Edema: none. No cyanosis. No clubbing. 2+ DP pulses LYMPHATIC: no cervical/axillary/inguinal lymph nodes appreciated MUSCULOSKELETAL: No point tenderness. No bulk atrophy.  SKIN:  Pressure sore @ LLE and R knee. NEUROLOGIC: Doll's eyes intact. Corneal reflex intact. Spontaneous respirations intact. Cranial nerves II-XII are grossly symmetric and physiologic. Babinski absent. No sensory deficit. Motor: 5/5 @ RUE, 5/5 @ LUE, 5/5 @ RLL,  5/5 @ LLL.  DTR: 2+ @ R biceps, 2+ @ L biceps, 2+ @ R patellar,  2+ @ L patellar. No cerebellar signs. Gait was not assessed.   Resolved Hospital Problem list     Assessment & Plan:   ASSESSMENT/PLAN:  ASSESSMENT (included in the Hospital Problem List)  Principal Problem:   Hypovolemic shock  (HCC) Active Problems:   Dehydration   AKI (acute kidney injury) (HCC)   Altered mental status   Metabolic encephalopathy   Lactic acidosis   By systems: PULMONARY: No acute issues Supplemental oxygen as needed to maintain SpO2 93+%.   CARDIOVASCULAR Shock, hypovolemic Aggressive IV fluid resuscitation.  Norepinephrine as needed to maintain MAP 65+. Monitor for signs of abnormal blood loss CVC placement Check SvO2 CVP monitoring  RENAL Acute kidney injury Lactic acidosis Place Foley catheter. Strict I/O. Monitor urine output. Renal dosing of medications. Avoid nephrotoxic drugs.   GASTROINTESTINAL: No acute issues GI PROPHYLAXIS: Protonix   HEMATOLOGIC Hemoconcentration secondary to dehydration DVT PROPHYLAXIS: heparin   INFECTIOUS: No acute issues Check SvO2   ENDOCRINE: No acute issues   NEUROLOGIC Altered mental status, presumably ischemic +/-  metabolic encephlaopathy Monitor neurologic status   PLAN/RECOMMENDATIONS  Admit to ICU under my service (Attending: Marcelle Smiling, MD) with the diagnoses highlighted above in the active Hospital Problem List (ASSESSMENT).     My assessment, plan of care, findings, medications, side effects, etc. were discussed with: nurse and patient's next of kin (answered all questions to his/her satisfaction).   Best practice:  Diet: NPO for now Pain/Anxiety/Delirium protocol (if indicated): N/A VAP protocol (if indicated): N/A DVT prophylaxis: heparin GI prophylaxis: Protonix Glucose control: N/A Mobility/Activity: bedrest   Code Status: Full Code Family Communication:  healthcare power of attorney Juliann Pares) Disposition: admit to ICU   Labs   CBC: Recent Labs  Lab 05/09/2021 2314 04/18/2021 2315  HGB 18.0* 18.0*  HCT 53.0* 53.0*    Basic Metabolic Panel: Recent Labs  Lab 04/29/2021 2314 05/04/2021 2315  NA 155* 155*  K 4.1 4.1  CL 118*  --   GLUCOSE 151*  --   BUN 107*  --   CREATININE 2.80*  --     GFR: CrCl cannot be calculated (Unknown ideal weight.). Recent Labs  Lab 04/17/2021 2301  LATICACIDVEN 6.5*    Liver Function Tests: No results for input(s): AST, ALT, ALKPHOS, BILITOT, PROT, ALBUMIN in the last 168 hours. No results for input(s): LIPASE, AMYLASE in the last 168 hours. No results for input(s): AMMONIA in the last 168 hours.  ABG    Component Value Date/Time   PHART 7.409 12/13/2014 0441   PCO2ART 36.9 12/13/2014 0441   PO2ART 101.0 (H) 12/13/2014 0441   HCO3 23.4 04/20/2021 2315   TCO2 25 04/22/2021 2315   ACIDBASEDEF 1.0 05/05/2021 2315   O2SAT 87.0 05/08/2021 2315     Coagulation Profile: Recent Labs  Lab 05/05/2021 2301  INR 1.5*    Cardiac Enzymes: No results for input(s): CKTOTAL, CKMB, CKMBINDEX, TROPONINI in the last 168 hours.  HbA1C: No results found for: HGBA1C  CBG: No results for input(s): GLUCAP in the last 168 hours.   Past Medical History   Past Medical History:  Diagnosis Date   Abdominal pain, left lower quadrant    Acute asthmatic bronchitis    Anemia    Crohn's disease (HCC)    Diverticulosis of colon    Hypertension    Nausea    Nephrolithiasis       Surgical History    Past Surgical History:  Procedure Laterality Date   CATARACT EXTRACTION Right    VAGINAL HYSTERECTOMY        Social History   Social History   Socioeconomic History   Marital status: Single    Spouse name: Not on file   Number of children: Not on file   Years of education: Not on file   Highest education level: Not on file  Occupational History   Occupation: Retired    Associate Professor: RETIRED  Tobacco Use   Smoking status: Never   Smokeless tobacco: Never  Substance and Sexual Activity   Alcohol use: Not on file   Drug use: Not on file   Sexual activity: Not on file  Other Topics Concern   Not on file  Social History Narrative   Not on file   Social Determinants of Health   Financial Resource Strain: Not on file  Food  Insecurity: Not on file  Transportation Needs: Not on file  Physical Activity: Not on file  Stress: Not on file  Social Connections: Not on file      Family History  Family History  Problem Relation Age of Onset   Esophageal cancer Father    Liver cancer Brother    Leukemia Brother    Colon cancer Neg Hx    Rectal cancer Neg Hx    Stomach cancer Neg Hx    family history includes Esophageal cancer in her father; Leukemia in her brother; Liver cancer in her brother. There is no history of Colon cancer, Rectal cancer, or Stomach cancer.    Allergies Allergies  Allergen Reactions   Iron Dextran Diarrhea and Nausea And Vomiting      Current Medications  Current Facility-Administered Medications:    0.9 %  sodium chloride infusion, 250 mL, Intravenous, Continuous, Hoffman, Clarene Critchley, NP   ceFEPIme (MAXIPIME) 2 g in sodium chloride 0.9 % 100 mL IVPB, 2 g, Intravenous, Once, Caccavale, Sophia, PA-C   docusate sodium (COLACE) capsule 100 mg, 100 mg, Oral, BID PRN, Duayne Cal, NP   ferric gluconate (NULECIT) 125 mg in sodium chloride 0.9 % 100 mL IVPB, 125 mg, Intravenous, Once, Nandigam, Kavitha V, MD   ferric gluconate (NULECIT) 25 mg in sodium chloride 0.9 % 50 mL IVPB, 25 mg, Intravenous, Once, Nandigam, Kavitha V, MD   heparin injection 5,000 Units, 5,000 Units, Subcutaneous, Q8H, Hoffman, Clarene Critchley, NP   lactated ringers bolus 1,000 mL, 1,000 mL, Intravenous, Once, Caccavale, Sophia, PA-C, Last Rate: 999 mL/hr at 04/20/21 0233, 1,000 mL at 04/20/21 0233   lactated ringers infusion, , Intravenous, Continuous, Caccavale, Sophia, PA-C   lactated ringers infusion, , Intravenous, Continuous, Caccavale, Sophia, PA-C, Last Rate: 999 mL/hr at 04/20/21 0234, New Bag at 04/20/21 0234   metroNIDAZOLE (FLAGYL) IVPB 500 mg, 500 mg, Intravenous, Once, Caccavale, Sophia, PA-C   norepinephrine (LEVOPHED) 4mg  in premix infusion, 5-50 mcg/min, Intravenous, Titrated, , NP,  Last Rate: 30 mL/hr at 04/20/21 0206, 8 mcg/min at 04/20/21 0206   pantoprazole (PROTONIX) injection 40 mg, 40 mg, Intravenous, QHS, Hoffman, 06/20/21, NP   polyethylene glycol (MIRALAX / GLYCOLAX) packet 17 g, 17 g, Oral, Daily PRN, Clarene Critchley, NP   vancomycin (VANCOREADY) IVPB 1250 mg/250 mL, 1,250 mg, Intravenous, Once, Caccavale, Sophia, PA-C  Current Outpatient Medications:    acetaminophen (TYLENOL) 500 MG tablet, Take 500 mg by mouth as needed for mild pain or headache. , Disp: , Rfl:    albuterol (PROVENTIL HFA;VENTOLIN HFA) 108 (90 BASE) MCG/ACT inhaler, Inhale 1 puff into the lungs every 6 (six) hours as needed (For asthma.). , Disp: , Rfl:    amoxicillin-clavulanate (AUGMENTIN) 875-125 MG tablet, TAKE 1 TABLET BY MOUTH EVERY 12 HOURS, Disp: 20 tablet, Rfl: 0   aspirin EC 81 MG tablet, Take 81 mg by mouth daily., Disp: , Rfl:    dicyclomine (BENTYL) 10 MG capsule, TAKE 1 CAPSULE TWICE DAILY, Disp: 180 capsule, Rfl: 2   dimenhyDRINATE (DRAMAMINE) 50 MG tablet, Take 50 mg by mouth every 8 (eight) hours as needed for nausea., Disp: , Rfl:    folic acid (FOLVITE) 1 MG tablet, TAKE 1 TABLET EVERY DAY, Disp: 90 tablet, Rfl: 0   furosemide (LASIX) 20 MG tablet, Take 20 mg by mouth daily.  , Disp: , Rfl:    lisinopril (PRINIVIL,ZESTRIL) 10 MG tablet, Take 10 mg by mouth daily.  , Disp: , Rfl:    metoprolol succinate (TOPROL-XL) 25 MG 24 hr tablet, Take 25 mg by mouth daily., Disp: , Rfl:    ondansetron (ZOFRAN) 4 MG tablet, Take 1 tablet (4 mg total) by  mouth every 6 (six) hours as needed for nausea or vomiting. (Patient not taking: Reported on 07/11/2015), Disp: 30 tablet, Rfl: 0   simvastatin (ZOCOR) 20 MG tablet, Take 20 mg by mouth at bedtime.  , Disp: , Rfl:    sulfaSALAzine (AZULFIDINE) 500 MG tablet, TAKE 2 TABLETS TWICE DAILY, Disp: 180 tablet, Rfl: 0   Home Medications  Prior to Admission medications   Medication Sig Start Date End Date Taking? Authorizing Provider   acetaminophen (TYLENOL) 500 MG tablet Take 500 mg by mouth as needed for mild pain or headache.     [provider]  albuterol (PROVENTIL HFA;VENTOLIN HFA) 108 (90 BASE) MCG/ACT inhaler Inhale 1 puff into the lungs every 6 (six) hours as needed (For asthma.).     [provider]  amoxicillin-clavulanate (AUGMENTIN) 875-125 MG tablet TAKE 1 TABLET BY MOUTH EVERY 12 HOURS 10/13/20 10/13/21  Marden NobleGates, Robert, MD  aspirin EC 81 MG tablet Take 81 mg by mouth daily.    [provider]  dicyclomine (BENTYL) 10 MG capsule TAKE 1 CAPSULE TWICE DAILY 08/29/15   Napoleon FormNandigam, Kavitha V, MD  dimenhyDRINATE (DRAMAMINE) 50 MG tablet Take 50 mg by mouth every 8 (eight) hours as needed for nausea.    [provider]  folic acid (FOLVITE) 1 MG tablet TAKE 1 TABLET EVERY DAY 05/04/15   Hart CarwinBrodie, Dora M, MD  furosemide (LASIX) 20 MG tablet Take 20 mg by mouth daily.      [provider]  lisinopril (PRINIVIL,ZESTRIL) 10 MG tablet Take 10 mg by mouth daily.      [provider]  metoprolol succinate (TOPROL-XL) 25 MG 24 hr tablet Take 25 mg by mouth daily.    [provider]  ondansetron (ZOFRAN) 4 MG tablet Take 1 tablet (4 mg total) by mouth every 6 (six) hours as needed for nausea or vomiting. Patient not taking: Reported on 07/11/2015 12/15/14   Calvert Cantorizwan, Saima, MD  simvastatin (ZOCOR) 20 MG tablet Take 20 mg by mouth at bedtime.      [provider]  sulfaSALAzine (AZULFIDINE) 500 MG tablet TAKE 2 TABLETS TWICE DAILY 09/05/15   Napoleon FormNandigam, Kavitha V, MD      Critical care time: 60 minutes.  The treatment and management of the patient's condition was required based on the threat of imminent deterioration. This time reflects time spent by the physician evaluating, providing care and managing the critically ill patient's care. The time was spent at the immediate bedside (or on the same floor/unit and dedicated to this patient's care). Time involved in separately  billable procedures is NOT included int he critical care time indicated above. Family meeting and update time may be included above if and only if the patient is unable/incompetent to participate in clinical interview and/or decision making, and the discussion was necessary to determining treatment decisions.   Marcelle SmilingSeong-Joo Cortana Vanderford, MD Board Certified by the ABIM, Pulmonary Diseases & Critical Care Medicine

## 2021-04-20 NOTE — ED Notes (Signed)
Vancomycin half infused stopped per critical care to start levophed at 0200am

## 2021-04-20 NOTE — ED Notes (Signed)
The pt has had a total of 5 liters wo per order of critical care some of the liters not listed   Down time in epic did not allow  documentation
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
## 2021-04-20 NOTE — ED Notes (Signed)
Pain in her lt buttocks and her rt knee with movement

## 2021-04-20 NOTE — Progress Notes (Signed)
eLink Physician-Brief Progress Note Patient Name: Cassandra Garcia DOB: [birth year] MRN: [id number]   Date of Service  04/20/2021  HPI/Events of Note  Pain - Patient is NPO.  eICU Interventions  Plan: Fentanyl 12.5-25 mcg IV Q 2 hours PRN pain.     Intervention Category Major Interventions: Other:  Lenell Antu 04/20/2021, 4:16 AM

## 2021-04-20 NOTE — Progress Notes (Signed)
Assisted tele visit to patient with family member.  Nafisah Runions M, RN   

## 2021-04-20 NOTE — Progress Notes (Signed)
Going into multiorgan failure, POA coming in, has stated to nurse she would not want all of this.  Myrla Halsted MD PCCM

## 2021-04-20 NOTE — ED Notes (Signed)
Central line  Placed by  Critical care
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
## 2021-04-20 NOTE — Progress Notes (Signed)
PHARMACY - PHYSICIAN COMMUNICATION CRITICAL VALUE ALERT - BLOOD CULTURE IDENTIFICATION (BCID)  Cassandra Garcia is an 86 y.o. female who presented to [facility] on May 09, 2021 with a chief complaint of being unresponsive.  Assessment:  pt comfort care now, remains on ABX, GNRs on blood culture but no BCID or resistance detected  Name of physician (or Provider) Contacted: n/a  Current antibiotics: cefepime  Changes to prescribed antibiotics recommended:  Patient is on recommended antibiotics - No changes needed  Results for orders placed or performed during the hospital encounter of 05/09/2021  Blood Culture ID Panel (Reflexed) (Collected: 05/09/2021 11:01 PM)  Result Value Ref Range   Enterococcus faecalis NOT DETECTED NOT DETECTED   Enterococcus Faecium NOT DETECTED NOT DETECTED   Listeria monocytogenes NOT DETECTED NOT DETECTED   Staphylococcus species NOT DETECTED NOT DETECTED   Staphylococcus aureus (BCID) NOT DETECTED NOT DETECTED   Staphylococcus epidermidis NOT DETECTED NOT DETECTED   Staphylococcus lugdunensis NOT DETECTED NOT DETECTED   Streptococcus species NOT DETECTED NOT DETECTED   Streptococcus agalactiae NOT DETECTED NOT DETECTED   Streptococcus pneumoniae NOT DETECTED NOT DETECTED   Streptococcus pyogenes NOT DETECTED NOT DETECTED   A.calcoaceticus-baumannii NOT DETECTED NOT DETECTED   Bacteroides fragilis NOT DETECTED NOT DETECTED   Enterobacterales NOT DETECTED NOT DETECTED   Enterobacter cloacae complex NOT DETECTED NOT DETECTED   Escherichia coli NOT DETECTED NOT DETECTED   Klebsiella aerogenes NOT DETECTED NOT DETECTED   Klebsiella oxytoca NOT DETECTED NOT DETECTED   Klebsiella pneumoniae NOT DETECTED NOT DETECTED   Proteus species NOT DETECTED NOT DETECTED   Salmonella species NOT DETECTED NOT DETECTED   Serratia marcescens NOT DETECTED NOT DETECTED   Haemophilus influenzae NOT DETECTED NOT DETECTED   Neisseria meningitidis NOT DETECTED NOT DETECTED    Pseudomonas aeruginosa NOT DETECTED NOT DETECTED   Stenotrophomonas maltophilia NOT DETECTED NOT DETECTED   Candida albicans NOT DETECTED NOT DETECTED   Candida auris NOT DETECTED NOT DETECTED   Candida glabrata NOT DETECTED NOT DETECTED   Candida krusei NOT DETECTED NOT DETECTED   Candida parapsilosis NOT DETECTED NOT DETECTED   Candida tropicalis NOT DETECTED NOT DETECTED   Cryptococcus neoformans/gattii NOT DETECTED NOT DETECTED    Mosetta Anis 04/20/2021  9:53 PM

## 2021-04-20 NOTE — Procedures (Signed)
Central Venous Catheter Insertion Procedure Note  Cassandra Garcia  [id number]  [birth year]  Date:04/20/21  Time:2:32 AM   Provider Performing:Soniyah Mcglory W Mikey Bussing   Procedure: Insertion of Non-tunneled Central Venous Catheter(36556) with US guidance (64332)   Indication(s) Medication administration  Consent Unable to obtain consent due to emergent nature of procedure.  Anesthesia Topical only with 1% lidocaine   Timeout Verified patient identification, verified procedure, site/side was marked, verified correct patient position, special equipment/implants available, medications/allergies/relevant history reviewed, required imaging and test results available.  Sterile Technique Maximal sterile technique including full sterile barrier drape, hand hygiene, sterile gown, sterile gloves, mask, hair covering, sterile ultrasound probe cover (if used).  Procedure Description Area of catheter insertion was cleaned with chlorhexidine and draped in sterile fashion.  With real-time ultrasound guidance a central venous catheter was placed into the left internal jugular vein. Nonpulsatile blood flow and easy flushing noted in all ports.  The catheter was sutured in place and sterile dressing applied.  Complications/Tolerance None; patient tolerated the procedure well. Chest X-ray is ordered to verify placement for internal jugular or subclavian cannulation.   Chest x-ray is not ordered for femoral cannulation.  EBL Minimal  Specimen(s) None   Joneen Roach, AGACNP-BC [facility]  See Amion for personal pager PCCM on call pager [phone] until 7pm. Please call Elink 7p-7a. [phone]  04/20/2021 2:33 AM

## 2021-04-20 NOTE — Progress Notes (Signed)
Pharmacy Antibiotic Note  Apollonia Amini is a 86 y.o. female admitted on 04/29/21 with AMS, possible sepsis.  Pharmacy has been consulted for Vancomycin and Cefepime  dosing. Vancomycin was started in the ED, but only part of the dose was given to to limited IV access.  Central line now placed  Plan: Vancomycin 1000 mg IV now F/U renal function and redose as indicated Cefepime 2 g IV q24h     Temp (24hrs), Avg:96.8 F (36 C), Min:95.4 F (35.2 C), Max:98.3 F (36.8 C)  Recent Labs  Lab 2021/04/29 2301 Apr 29, 2021 2314  WBC 44.2*  --   CREATININE 2.95* 2.80*  LATICACIDVEN 6.5*  --     CrCl cannot be calculated (Unknown ideal weight.).    Allergies  Allergen Reactions   Iron Dextran Diarrhea and Nausea And Vomiting    Eddie Candle 04/20/2021 3:59 AM

## 2021-04-20 NOTE — ED Notes (Signed)
The pt appears more alert talking some at present.  Skin color improved

## 2021-04-20 NOTE — Progress Notes (Signed)
  Interdisciplinary Goals of Care Family Meeting   Date carried out:: 04/20/2021  Location of the meeting: Bedside  Member's involved: Dr. Katrinka Blazing, Candise Bowens RN, Karle Barr NP, Amy Renae Gloss [facility], present), and  Candace Gallus (niece, phone).  Durable Power of Attorney or acting medical decision maker:  Juliann Pares POA  Discussion: We discussed goals of care for Larue Drawdy.  Dr. Katrinka Blazing, Candise Bowens RN, myself, Amy Renae Gloss [facility], present), and niece Ailana Cuadrado via phone.   We discussed Carols life. She does not have any children. She helped raise Amy's late husband and Amy has remained involved with her life.  Dr. Katrinka Blazing notified Okey Regal and Raynelle Fanning that despite treatment with vasopressors, fluid resuscitation, and antibiotics, that she was not getting better. Her kidneys and liver are beginning to fail and she has numerous wounds which would require extensive hydrotherapy.  Amy and Raynelle Fanning stated that Evalie "did not want to be on life support with no way out, "did not want to be kept alive if she could not return to normal life," including "walking around" and "helping at the [employer]." The then stated that she would "not want heroic measures" and that "she would not be happy with that" if that course was taken.  Based on the discussion of Carols wishes with her niece and POA the joint decision was made to pursue a treatment course focused on comfort at this time.  Code status: Full DNR  Disposition: In-patient comfort care  Gershon Mussel., MSN, APRN, AGACNP-BC [facility]  04/20/2021 , 3:02 PM  Please see [email or web address] for pager details  If no response, please call [phone] After hours, please call Elink at [phone]

## 2021-04-20 NOTE — Progress Notes (Signed)
Echocardiogram 2D Echocardiogram has been performed.  Warren Lacy Jacobey Gura RDCS 04/20/2021, 1:58 PM

## 2021-04-22 LAB — CULTURE, BLOOD (ROUTINE X 2)

## 2021-04-22 LAB — URINE CULTURE: Culture: >=100000 — AB

## 2021-04-22 LAB — PATHOLOGIST SMEAR REVIEW

## 2021-04-27 LAB — CULTURE, BLOOD (ROUTINE X 2)

## 2021-05-11 NOTE — Sedation Documentation (Signed)
Wasted 100 mg/100 cc of Morphine from bag with [facility]. Pt expired shortly after hanging new bag

## 2021-05-11 NOTE — Death Summary Note (Signed)
DEATH SUMMARY   Patient Details  Name: Cassandra Garcia MRN: [id number] DOB: Jul 12, 1935  Admission/Discharge Information   Admit Date:  05/04/21  Date of Death: Date of Death: 2021-05-06  Time of Death: Time of Death: 2054/01/02  Length of Stay: 1  Referring Physician: Marden Noble, MD   Reason(s) for Hospitalization  Septic and hypovolemic shock with subsequent multiorgan failure (liver, kidney, heart, CNS, bone marrow).  Severe pressure injuries, muscular deconditioning POA DNR  Brief Hospital Course (including significant findings, care, treatment, and services provided and events leading to death)  This 86 y.o. Caucasian female presented to the [facility] Emergency Department via EMS with complaints of altered mental status.  The patient's last known well time is 4 days ago, based on telephone contact with family members.  Due to concerns for her well-being, a welfare check was apparently requested that resulted in discovering her to be on the floor and unresponsive.  Her initial BP was reported to be 68/palp.  On arrival to ER, she remained hypotensive but did demonstrate some improvement in mentation.   At the time of clinical encounter, the patient is arousable.  She follows some commands but does not verbally respond appropriately.  She is unable to provide answers for orientation but denies pain or discomfort.  CT head and neck were unrevealing for acute processes.  She was found to have multiple deep pressure ulcers that would have required surgical debridement.   Despite aggressive care including pressors, broad spectrum antibiotics, supplemental bicarbonate, supplemental oxygen, she continued to deteriorate and went into multiorgan failure.  Family meeting was held and given rapidly deteriorating clinical trajectory, patient's previously stated wishes, family allowed her to pass in peace.   Pertinent Labs and Studies  Significant Diagnostic Studies DG Ribs  Unilateral W/Chest Left  Result Date: May 04, 2021 CLINICAL DATA:  Fall EXAM: LEFT RIBS AND CHEST - 3+ VIEW COMPARISON:  Chest x-ray 05/02/2016 FINDINGS: Single-view chest demonstrates clear right lung fields. Atelectasis at the lingula and left base. No pleural effusion or pneumothorax. Retrocardiac opacity likely due to hiatal hernia. Left rib series demonstrates no definite acute displaced left rib fracture. IMPRESSION: 1. Atelectasis at the lingula and left base. No visible pneumothorax. 2. No definite acute displaced left rib fracture 3. Retrocardiac opacity likely due to hiatal hernia Electronically Signed   By: Jasmine Pang M.D.   On: 05-04-2021 23:03   CT HEAD WO CONTRAST ( )  Result Date: 05-04-2021 CLINICAL DATA:  Found down, unresponsive EXAM: CT HEAD WITHOUT CONTRAST CT CERVICAL SPINE WITHOUT CONTRAST TECHNIQUE: Multidetector CT imaging of the head and cervical spine was performed following the standard protocol without intravenous contrast. Multiplanar CT image reconstructions of the cervical spine were also generated. COMPARISON:  None. FINDINGS: CT HEAD FINDINGS Brain: No evidence of acute infarction, hemorrhage, hydrocephalus, extra-axial collection or mass lesion/mass effect. Vascular: Intracranial atherosclerosis. Skull: Normal. Negative for fracture or focal lesion. Sinuses/Orbits: The visualized paranasal sinuses are essentially clear. The mastoid air cells are unopacified. Other: None. CT CERVICAL SPINE FINDINGS Alignment: Normal cervical lordosis. Skull base and vertebrae: No acute fracture. No primary bone lesion or focal pathologic process. Soft tissues and spinal canal: No prevertebral fluid or swelling. No visible canal hematoma. Disc levels: Mild degenerative changes. Fusion at C5-6. Spinal canal is patent. Upper chest: Visualized lung apices are clear. Other: Visualized thyroid is unremarkable. IMPRESSION: No evidence of acute intracranial abnormality. No evidence of traumatic  injury to the cervical spine. C5-6 fusion. Mild degenerative changes. Electronically  Signed   By: Charline Bills M.D.   On: 05-11-2021 22:41   CT Cervical Spine Wo Contrast  Result Date: 05-11-21 CLINICAL DATA:  Found down, unresponsive EXAM: CT HEAD WITHOUT CONTRAST CT CERVICAL SPINE WITHOUT CONTRAST TECHNIQUE: Multidetector CT imaging of the head and cervical spine was performed following the standard protocol without intravenous contrast. Multiplanar CT image reconstructions of the cervical spine were also generated. COMPARISON:  None. FINDINGS: CT HEAD FINDINGS Brain: No evidence of acute infarction, hemorrhage, hydrocephalus, extra-axial collection or mass lesion/mass effect. Vascular: Intracranial atherosclerosis. Skull: Normal. Negative for fracture or focal lesion. Sinuses/Orbits: The visualized paranasal sinuses are essentially clear. The mastoid air cells are unopacified. Other: None. CT CERVICAL SPINE FINDINGS Alignment: Normal cervical lordosis. Skull base and vertebrae: No acute fracture. No primary bone lesion or focal pathologic process. Soft tissues and spinal canal: No prevertebral fluid or swelling. No visible canal hematoma. Disc levels: Mild degenerative changes. Fusion at C5-6. Spinal canal is patent. Upper chest: Visualized lung apices are clear. Other: Visualized thyroid is unremarkable. IMPRESSION: No evidence of acute intracranial abnormality. No evidence of traumatic injury to the cervical spine. C5-6 fusion. Mild degenerative changes. Electronically Signed   By: Charline Bills M.D.   On: 11-May-2021 22:41   DG Chest Port 1 View  Result Date: 04/20/2021 CLINICAL DATA:  Central line placement EXAM: PORTABLE CHEST 1 VIEW COMPARISON:  2021/05/11 FINDINGS: Left IJ venous catheter terminates in the upper SVC. Mild increased interstitial markings. No focal consolidation. No pleural effusion or pneumothorax. The heart is normal in size. Moderate to large hiatal hernia. IMPRESSION:  Left IJ venous catheter terminating in the upper SVC. Electronically Signed   By: Charline Bills M.D.   On: 04/20/2021 02:50   DG Knee Complete 4 Views Right  Result Date: 05-11-21 CLINICAL DATA:  Fall EXAM: RIGHT KNEE - COMPLETE 4+ VIEW COMPARISON:  None. FINDINGS: No fracture or malalignment. Moderate severe tricompartment arthritis. No significant knee effusion IMPRESSION: Moderate severe tricompartment arthritis. No acute osseous abnormality Electronically Signed   By: Jasmine Pang M.D.   On: 05/11/2021 23:04   ECHOCARDIOGRAM COMPLETE  Result Date: 04/20/2021    ECHOCARDIOGRAM REPORT   Patient Name:   AYELEN SCIORTINO Date of Exam: 04/20/2021 Medical Rec #:  [id number]       Height:       64.0 in Accession #:    [id number]      Weight:       149.7 lb Date of Birth:  [birth year]        BSA:          1.730 m Patient Age:    86 years        BP:           90/51 mmHg Patient Gender: F               HR:           103 bpm. Exam Location:  Inpatient Procedure: 2D Echo, Color Doppler and Cardiac Doppler Indications:    I51.7 Cardiomegaly  History:        Patient has prior history of Echocardiogram examinations, most                 recent 12/13/2014. Risk Factors:Hypertension.  Sonographer:    Irving Burton Senior RDCS Referring Phys: [id number] Lorin Glass  Sonographer Comments: Scanned supine, unable to reposition patient. IMPRESSIONS  1. Left ventricular ejection fraction, by estimation, is 65 to 70%. The left ventricle has  normal function. The left ventricle has no regional wall motion abnormalities. Left ventricular diastolic parameters are consistent with Grade I diastolic dysfunction (impaired relaxation). There is incoordinate septal motion.  2. Right ventricular systolic function is normal. The right ventricular size is normal.  3. The mitral valve is grossly normal. No evidence of mitral valve regurgitation.  4. The aortic valve is tricuspid. Aortic valve regurgitation is not visualized.  5. The inferior vena  cava is normal in size with greater than 50% respiratory variability, suggesting right atrial pressure of 3 mmHg. FINDINGS  Left Ventricle: Left ventricular ejection fraction, by estimation, is 65 to 70%. The left ventricle has normal function. The left ventricle has no regional wall motion abnormalities. The left ventricular internal cavity size was normal in size. There is  no left ventricular hypertrophy. Incoordinate septal motion. Left ventricular diastolic parameters are consistent with Grade I diastolic dysfunction (impaired relaxation). Indeterminate filling pressures. Right Ventricle: The right ventricular size is normal. No increase in right ventricular wall thickness. Right ventricular systolic function is normal. Left Atrium: Left atrial size was normal in size. Right Atrium: Right atrial size was normal in size. Pericardium: There is no evidence of pericardial effusion. Mitral Valve: The mitral valve is grossly normal. No evidence of mitral valve regurgitation. Tricuspid Valve: The tricuspid valve is not well visualized. Tricuspid valve regurgitation is not demonstrated. Aortic Valve: The aortic valve is tricuspid. Aortic valve regurgitation is not visualized. Pulmonic Valve: The pulmonic valve was normal in structure. Pulmonic valve regurgitation is not visualized. Aorta: The aortic root and ascending aorta are structurally normal, with no evidence of dilitation. Venous: The inferior vena cava is normal in size with greater than 50% respiratory variability, suggesting right atrial pressure of 3 mmHg. IAS/Shunts: No atrial level shunt detected by color flow Doppler.  LEFT VENTRICLE PLAX 2D LVIDd:         2.50 cm  Diastology LVIDs:         1.60 cm  LV e' medial:    4.90 cm/s LV PW:         0.90 cm  LV E/e' medial:  16.3 LV IVS:        0.80 cm  LV e' lateral:   5.77 cm/s LVOT diam:     1.70 cm  LV E/e' lateral: 13.9 LV SV:         33 LV SV Index:   19 LVOT Area:     2.27 cm  RIGHT VENTRICLE RV S prime:      19.00 cm/s TAPSE (M-mode): 1.8 cm LEFT ATRIUM             Index       RIGHT ATRIUM           Index LA diam:        1.70 cm 0.98 cm/m  RA Area:     11.80 cm LA Vol (A2C):   42.6 ml 24.63 ml/m RA Volume:   23.60 ml  13.64 ml/m LA Vol (A4C):   52.8 ml 30.53 ml/m LA Biplane Vol: 48.2 ml 27.87 ml/m  AORTIC VALVE LVOT Vmax:   110.00 cm/s LVOT Vmean:  79.700 cm/s LVOT VTI:    0.147 m  AORTA Ao Asc diam: 3.10 cm MITRAL VALVE MV Area (PHT): 2.08 cm     SHUNTS MV Decel Time: 364 msec     Systemic VTI:  0.15 m MV E velocity: 80.00 cm/s   Systemic Diam: 1.70 cm MV A velocity: 109.00 cm/s  MV E/A ratio:  0.73 Zoila Shutter MD Electronically signed by Zoila Shutter MD Signature Date/Time: 04/20/2021/3:01:20 PM    Final    DG Hip Unilat W or Wo Pelvis 2-3 Views Left  Result Date: Apr 28, 2021 CLINICAL DATA:  Fall EXAM: DG HIP (WITH OR WITHOUT PELVIS) 2-3V LEFT COMPARISON:  None. FINDINGS: There is no evidence of hip fracture or dislocation. There is no evidence of arthropathy or other focal bone abnormality. IMPRESSION: Negative. Electronically Signed   By: Jasmine Pang M.D.   On: 2021/04/28 23:04    Microbiology Recent Results (from the past 240 hour(s))  Blood Culture (routine x 2)     Status: Abnormal   Collection Time: 2021/04/28 11:01 PM   Specimen: BLOOD  Result Value Ref Range Status   Specimen Description BLOOD RIGHT FOREARM  Final   Special Requests   Final    BOTTLES DRAWN AEROBIC AND ANAEROBIC Blood Culture results may not be optimal due to an inadequate volume of blood received in culture bottles   Culture  Setup Time   Final    GRAM NEGATIVE RODS IN BOTH AEROBIC AND ANAEROBIC BOTTLES Organism ID to follow CRITICAL RESULT CALLED TO, READ BACK BY AND VERIFIED WITH: Magda Kiel [facility] 2150 04/20/21 A BROWNING    Culture (A)  Final    FLAVOBACTERIUM ODORATUM Standardized susceptibility testing for this organism is not available. Performed at [facility], [address], [place], [place]  [address]    Report Status 04/22/2021 FINAL  Final  Blood Culture ID Panel (Reflexed)     Status: None   Collection Time: 2021/04/28 11:01 PM  Result Value Ref Range Status   Enterococcus faecalis NOT DETECTED NOT DETECTED Final   Enterococcus Faecium NOT DETECTED NOT DETECTED Final   Listeria monocytogenes NOT DETECTED NOT DETECTED Final   Staphylococcus species NOT DETECTED NOT DETECTED Final   Staphylococcus aureus (BCID) NOT DETECTED NOT DETECTED Final   Staphylococcus epidermidis NOT DETECTED NOT DETECTED Final   Staphylococcus lugdunensis NOT DETECTED NOT DETECTED Final   Streptococcus species NOT DETECTED NOT DETECTED Final   Streptococcus agalactiae NOT DETECTED NOT DETECTED Final   Streptococcus pneumoniae NOT DETECTED NOT DETECTED Final   Streptococcus pyogenes NOT DETECTED NOT DETECTED Final   A.calcoaceticus-baumannii NOT DETECTED NOT DETECTED Final   Bacteroides fragilis NOT DETECTED NOT DETECTED Final   Enterobacterales NOT DETECTED NOT DETECTED Final   Enterobacter cloacae complex NOT DETECTED NOT DETECTED Final   Escherichia coli NOT DETECTED NOT DETECTED Final   Klebsiella aerogenes NOT DETECTED NOT DETECTED Final   Klebsiella oxytoca NOT DETECTED NOT DETECTED Final   Klebsiella pneumoniae NOT DETECTED NOT DETECTED Final   Proteus species NOT DETECTED NOT DETECTED Final   Salmonella species NOT DETECTED NOT DETECTED Final   Serratia marcescens NOT DETECTED NOT DETECTED Final   Haemophilus influenzae NOT DETECTED NOT DETECTED Final   Neisseria meningitidis NOT DETECTED NOT DETECTED Final   Pseudomonas aeruginosa NOT DETECTED NOT DETECTED Final   Stenotrophomonas maltophilia NOT DETECTED NOT DETECTED Final   Candida albicans NOT DETECTED NOT DETECTED Final   Candida auris NOT DETECTED NOT DETECTED Final   Candida glabrata NOT DETECTED NOT DETECTED Final   Candida krusei NOT DETECTED NOT DETECTED Final   Candida parapsilosis NOT DETECTED NOT DETECTED Final   Candida  tropicalis NOT DETECTED NOT DETECTED Final   Cryptococcus neoformans/gattii NOT DETECTED NOT DETECTED Final    Comment: Performed at [facility], [address] [place], [place] [address]  Resp Panel by RT-PCR (Flu A&B, Covid) Nasopharyngeal Swab     Status: None   Collection Time: 04/20/21 12:11 AM   Specimen: Nasopharyngeal Swab; Nasopharyngeal(NP) swabs in vial transport medium  Result Value Ref Range Status   SARS Coronavirus 2 by RT PCR NEGATIVE NEGATIVE Final    Comment: (NOTE) SARS-CoV-2 target nucleic acids are NOT DETECTED.  The SARS-CoV-2 RNA is generally detectable in upper respiratory specimens during the acute phase of infection. The lowest concentration of SARS-CoV-2 viral copies this assay can detect is 138 copies/mL. A negative result does not preclude SARS-Cov-2 infection and should not be used as the sole basis for treatment or other patient management decisions. A negative result may occur with  improper specimen collection/handling, submission of specimen other than nasopharyngeal swab, presence of viral mutation(s) within the areas targeted by this assay, and inadequate number of viral copies(<138 copies/mL). A negative result must be combined with clinical observations, patient history, and epidemiological information. The expected result is Negative.  Fact Sheet for Patients:  BloggerCourse.com  Fact Sheet for Healthcare Providers:  SeriousBroker.it  This test is no t yet approved or cleared by the [place] FDA and  has been authorized for detection and/or diagnosis of SARS-CoV-2 by FDA under an Emergency Use Authorization (EUA). This EUA will remain  in effect (meaning this test can be used) for the duration of the COVID-19 declaration under Section 564(b)(1) of the Act, 21 U.S.C.section 360bbb-3(b)(1), unless the authorization is terminated  or revoked sooner.       Influenza A by PCR  NEGATIVE NEGATIVE Final   Influenza B by PCR NEGATIVE NEGATIVE Final    Comment: (NOTE) The Xpert Xpress SARS-CoV-2/FLU/RSV plus assay is intended as an aid in the diagnosis of influenza from Nasopharyngeal swab specimens and should not be used as a sole basis for treatment. Nasal washings and aspirates are unacceptable for Xpert Xpress SARS-CoV-2/FLU/RSV testing.  Fact Sheet for Patients: BloggerCourse.com  Fact Sheet for Healthcare Providers: SeriousBroker.it  This test is not yet approved or cleared by the [place] FDA and has been authorized for detection and/or diagnosis of SARS-CoV-2 by FDA under an Emergency Use Authorization (EUA). This EUA will remain in effect (meaning this test can be used) for the duration of the COVID-19 declaration under Section 564(b)(1) of the Act, 21 U.S.C. section 360bbb-3(b)(1), unless the authorization is terminated or revoked.  Performed at [facility], [address], [place], [place] [address]   Blood Culture (routine x 2)     Status: Abnormal (Preliminary result)   Collection Time: 04/20/21 12:55 AM   Specimen: BLOOD  Result Value Ref Range Status   Specimen Description BLOOD LEFT FOREARM  Final   Special Requests   Final    BOTTLES DRAWN AEROBIC AND ANAEROBIC Blood Culture results may not be optimal due to an inadequate volume of blood received in culture bottles   Culture  Setup Time   Final    GRAM NEGATIVE RODS IN BOTH AEROBIC AND ANAEROBIC BOTTLES CRITICAL VALUE NOTED.  VALUE IS CONSISTENT WITH PREVIOUSLY REPORTED AND CALLED VALUE.    Culture (A)  Final    FLAVOBACTERIUM ODORATUM Standardized susceptibility testing for this organism is not available.    Report Status PENDING  Incomplete  Urine Culture     Status: Abnormal   Collection Time: 04/20/21  3:11 AM   Specimen: In/Out Cath Urine  Result Value Ref Range Status   Specimen Description IN/OUT CATH URINE  Final  Special Requests NONE  Final   Culture (A)  Final    >=100,000 COLONIES/mL ESCHERICHIA COLI 40,000 COLONIES/mL GROUP B STREP(S.AGALACTIAE)ISOLATED TESTING AGAINST S. AGALACTIAE NOT ROUTINELY PERFORMED DUE TO PREDICTABILITY OF AMP/PEN/VAN SUSCEPTIBILITY. Performed at [facility], [address], [place], [place] [address]    Report Status 04/22/2021 FINAL  Final   Organism ID, Bacteria ESCHERICHIA COLI (A)  Final      Susceptibility   Escherichia coli - MIC*    AMPICILLIN >=32 RESISTANT Resistant     CEFAZOLIN <=4 SENSITIVE Sensitive     CEFEPIME <=0.12 SENSITIVE Sensitive     CEFTRIAXONE <=0.25 SENSITIVE Sensitive     CIPROFLOXACIN <=0.25 SENSITIVE Sensitive     GENTAMICIN <=1 SENSITIVE Sensitive     IMIPENEM <=0.25 SENSITIVE Sensitive     NITROFURANTOIN <=16 SENSITIVE Sensitive     TRIMETH/SULFA <=20 SENSITIVE Sensitive     AMPICILLIN/SULBACTAM 16 INTERMEDIATE Intermediate     PIP/TAZO <=4 SENSITIVE Sensitive     * >=100,000 COLONIES/mL ESCHERICHIA COLI  MRSA Next Gen by PCR, Nasal     Status: None   Collection Time: 04/20/21  4:52 AM   Specimen: Nasal Mucosa; Nasal Swab  Result Value Ref Range Status   MRSA by PCR Next Gen NOT DETECTED NOT DETECTED Final    Comment: (NOTE) The GeneXpert MRSA Assay (FDA approved for NASAL specimens only), is one component of a comprehensive MRSA colonization surveillance program. It is not intended to diagnose MRSA infection nor to guide or monitor treatment for MRSA infections. Test performance is not FDA approved in patients less than 44 years old. Performed at [facility], [address], [place], [place] [address]     Lab Basic Metabolic Panel: Recent Labs  Lab 04/20/2021 2301 04/12/2021 2314 04/25/2021 2315 04/20/21 0520 04/20/21 1329  NA 154* 155* 155* 150* 144  K 4.1 4.1 4.1 4.1 4.3  CL 115* 118*  --  114* 112*  CO2 21*  --   --  20* 19*  GLUCOSE 153* 151*  --  136* 213*  BUN 115* 107*  --  96* 95*  CREATININE  2.95* 2.80*  --  2.29* 2.47*  CALCIUM 10.0  --   --  8.1* 8.0*  MG  --   --   --  1.6*  --   PHOS  --   --   --  4.6  --    Liver Function Tests: Recent Labs  Lab 05/07/2021 2301 04/20/21 1329  AST 50* 261*  ALT 41 143*  ALKPHOS 118 92  BILITOT 1.8* 2.1*  PROT 5.7* 3.9*  ALBUMIN 2.5* 1.6*   No results for input(s): LIPASE, AMYLASE in the last 168 hours. No results for input(s): AMMONIA in the last 168 hours. CBC: Recent Labs  Lab 04/20/2021 2301 05/08/2021 2314 04/23/2021 2315 04/20/21 0520 04/20/21 1015 04/20/21 1329  WBC 44.2*  --   --  44.8*  --  49.5*  NEUTROABS 41.2*  --   --   --   --  46.6*  HGB 18.0* 18.0* 18.0* 14.1  --  14.6  HCT 57.3* 53.0* 53.0* 44.7  --  46.7*  MCV 100.9*  --   --  101.1*  --  101.1*  PLT 326  --   --  221 186 191   Cardiac Enzymes: Recent Labs  Lab 04/24/2021 2301  CKTOTAL 307*   Sepsis Labs: Recent Labs  Lab 04/25/2021 2301 04/20/21 0520 04/20/21 1329  WBC 44.2* 44.8* 49.5*  LATICACIDVEN 6.5* 4.2* 5.2*   Lorin Glass 04/24/2021, 2:34 PM

## 2021-05-11 DEATH — deceased
